# Patient Record
Sex: Female | Born: 1965 | Race: Black or African American | Hispanic: No | Marital: Married | Smoking: Never smoker
Health system: Southern US, Community
[De-identification: ages and names within clinical notes are randomized; demographics above are authoritative.]

## PROBLEM LIST (undated history)

## (undated) DIAGNOSIS — D689 Coagulation defect, unspecified: Secondary | ICD-10-CM

## (undated) DIAGNOSIS — J45909 Unspecified asthma, uncomplicated: Secondary | ICD-10-CM

## (undated) DIAGNOSIS — H269 Unspecified cataract: Secondary | ICD-10-CM

## (undated) DIAGNOSIS — K219 Gastro-esophageal reflux disease without esophagitis: Secondary | ICD-10-CM

## (undated) DIAGNOSIS — I10 Essential (primary) hypertension: Secondary | ICD-10-CM

## (undated) HISTORY — DX: Unspecified asthma, uncomplicated: J45.909

## (undated) HISTORY — PX: NOVASURE ABLATION: SHX5394

## (undated) HISTORY — PX: MYOMECTOMY: SHX85

## (undated) HISTORY — DX: Gastro-esophageal reflux disease without esophagitis: K21.9

## (undated) HISTORY — DX: Essential (primary) hypertension: I10

## (undated) HISTORY — DX: Unspecified cataract: H26.9

## (undated) HISTORY — DX: Coagulation defect, unspecified: D68.9

---

## 2011-07-02 ENCOUNTER — Other Ambulatory Visit: Payer: Self-pay | Admitting: Internal Medicine

## 2011-07-09 ENCOUNTER — Ambulatory Visit (INDEPENDENT_AMBULATORY_CARE_PROVIDER_SITE_OTHER): Payer: BC Managed Care – PPO | Admitting: Internal Medicine

## 2011-07-09 ENCOUNTER — Encounter: Payer: Self-pay | Admitting: Internal Medicine

## 2011-07-09 VITALS — BP 111/72 | HR 72 | Temp 98.6°F | Resp 16 | Ht 59.5 in | Wt 216.8 lb

## 2011-07-09 DIAGNOSIS — Z79899 Other long term (current) drug therapy: Secondary | ICD-10-CM

## 2011-07-09 DIAGNOSIS — I10 Essential (primary) hypertension: Secondary | ICD-10-CM

## 2011-07-09 DIAGNOSIS — E669 Obesity, unspecified: Secondary | ICD-10-CM | POA: Insufficient documentation

## 2011-07-09 MED ORDER — LISINOPRIL-HYDROCHLOROTHIAZIDE 20-25 MG PO TABS: 1.0000 | ORAL_TABLET | Freq: Every day | ORAL | Status: DC

## 2011-07-09 NOTE — Progress Notes (Signed)
Addended by: Mervin Kung on: 07/09/2011 01:21 PM   Modules accepted: Orders

## 2011-07-09 NOTE — Progress Notes (Signed)
  Subjective:    Patient ID: Debra Mendoza, female    DOB: 1966/01/26, 46 y.o.   MRN: [id number]  HPI  HTN controlled Review of Systems    stable Objective:   Physical Exam  Normal      Assessment & Plan:  RF meds

## 2011-12-31 ENCOUNTER — Ambulatory Visit (INDEPENDENT_AMBULATORY_CARE_PROVIDER_SITE_OTHER): Payer: BC Managed Care – PPO | Admitting: Internal Medicine

## 2011-12-31 ENCOUNTER — Encounter: Payer: Self-pay | Admitting: Internal Medicine

## 2011-12-31 VITALS — BP 145/91 | HR 69 | Temp 98.2°F | Resp 18 | Ht 60.0 in | Wt 219.0 lb

## 2011-12-31 DIAGNOSIS — Z79899 Other long term (current) drug therapy: Secondary | ICD-10-CM

## 2011-12-31 DIAGNOSIS — I10 Essential (primary) hypertension: Secondary | ICD-10-CM

## 2011-12-31 DIAGNOSIS — E669 Obesity, unspecified: Secondary | ICD-10-CM

## 2011-12-31 DIAGNOSIS — Z23 Encounter for immunization: Secondary | ICD-10-CM

## 2011-12-31 DIAGNOSIS — Z0000 Encounter for general adult medical examination without abnormal findings: Secondary | ICD-10-CM

## 2011-12-31 LAB — POCT UA - MICROSCOPIC ONLY
Crystals, Ur, HPF, POC: NEGATIVE
Yeast, UA: NEGATIVE

## 2011-12-31 LAB — TSH: TSH: 1.831 u[IU]/mL (ref 0.350–4.500)

## 2011-12-31 LAB — POCT GLYCOSYLATED HEMOGLOBIN (HGB A1C): Hemoglobin A1C: 5.8

## 2011-12-31 LAB — COMPREHENSIVE METABOLIC PANEL
ALT: 27 U/L (ref 0–35)
Albumin: 4.9 g/dL (ref 3.5–5.2)
CO2: 26 mEq/L (ref 19–32)
Calcium: 10.5 mg/dL (ref 8.4–10.5)
Chloride: 99 mEq/L (ref 96–112)
Creat: 1.09 mg/dL (ref 0.50–1.10)
Total Protein: 8.2 g/dL (ref 6.0–8.3)

## 2011-12-31 LAB — POCT URINALYSIS DIPSTICK
Glucose, UA: NEGATIVE
Ketones, UA: NEGATIVE
Leukocytes, UA: NEGATIVE
Spec Grav, UA: <=1.005
Urobilinogen, UA: 0.2

## 2011-12-31 LAB — LIPID PANEL: Cholesterol: 190 mg/dL (ref 0–200)

## 2011-12-31 NOTE — Progress Notes (Signed)
  Subjective:    Patient ID: Debra Mendoza, female    DOB: January 10, 1966, 46 y.o.   MRN: [id number]  HPI Gained weight, is obese Htn controlled at home. See scanned hx   Review of Systems See scanned ros    Objective:   Physical Exam  Constitutional: She is oriented to person, place, and time. Vital signs are normal. She appears well-nourished.       Obese  HENT:  Right Ear: External ear normal.  Left Ear: External ear normal.  Nose: Nose normal.  Mouth/Throat: Oropharynx is clear and moist.  Eyes: EOM are normal. Pupils are equal, round, and reactive to light.  Neck: Neck supple. No thyromegaly present.  Cardiovascular: Normal rate, regular rhythm and normal heart sounds.   Pulmonary/Chest: Effort normal and breath sounds normal.  Abdominal: Soft.  Genitourinary: No breast swelling, tenderness, discharge or bleeding. Pelvic exam was performed with patient supine.       Gyn does exam  Musculoskeletal: Normal range of motion.  Lymphadenopathy:    She has no cervical adenopathy.  Neurological: She is alert and oriented to person, place, and time. No cranial nerve deficit. Coordination normal.  Skin: Skin is warm and dry.  Psychiatric: She has a normal mood and affect.   ekg nl       Assessment & Plan:  HTN controlled Obesity needs attn

## 2011-12-31 NOTE — Patient Instructions (Signed)
Obesity Obesity is defined as having a body mass index (BMI) of 30 or more. To calculate your BMI divide your weight in pounds by your height in inches squared and multiply that product by 703. Major illnesses resulting from long-term obesity include:  Stroke.   Heart disease.   Diabetes.   Many cancers.   Arthritis.  Obesity also complicates recovery from many other medical problems.  CAUSES   A history of obesity in your parents.   Thyroid hormone imbalance.   Environmental factors such as excess calorie intake and physical inactivity.  TREATMENT  A healthy weight loss program includes:  A calorie restricted diet based on individual calorie needs.   Increased physical activity (exercise).  An exercise program is just as important as the right low-calorie diet.  Weight-loss medicines should be used only under the supervision of your physician. These medicines help, but only if they are used with diet and exercise programs. Medicines can have side effects including nervousness, nausea, abdominal pain, diarrhea, headache, drowsiness, and depression.  An unhealthy weight loss program includes:  Fasting.   Fad diets.   Supplements and drugs.  These choices do not succeed in long-term weight control.  HOME CARE INSTRUCTIONS  To help you make the needed dietary changes:   Exercise and perform physical activity as directed by your caregiver.   Keep a daily record of everything you eat. There are many free websites to help you with this. It may be helpful to measure your foods so you can determine if you are eating the correct portion sizes.   Use low-calorie cookbooks or take special cooking classes.   Avoid alcohol. Drink more water and drinks with no calories.   Take vitamins and supplements only as recommended by your caregiver.   Weight loss support groups, Registered Dieticians, counselors, and stress reduction education can also be very helpful.  Document Released:  04/26/2004 Document Revised: 03/08/2011 Document Reviewed: 02/23/2007 [facility] Patient Information 2012 [place], [place].Calorie Counting Diet A calorie counting diet requires you to eat the number of calories that are right for you in a day. Calories are the measurement of how much energy you get from the food you eat. Eating the right amount of calories is important for staying at a healthy weight. If you eat too many calories, your body will store them as fat and you may gain weight. If you eat too few calories, you may lose weight. Counting the number of calories you eat during a day will help you know if you are eating the right amount. A Registered Dietitian can determine how many calories you need in a day. The amount of calories needed varies from person to person. If your goal is to lose weight, you will need to eat fewer calories. Losing weight can benefit you if you are overweight or have health problems such as heart disease, high blood pressure, or diabetes. If your goal is to gain weight, you will need to eat more calories. Gaining weight may be necessary if you have a certain health problem that causes your body to need more energy. TIPS Whether you are increasing or decreasing the number of calories you eat during a day, it may be hard to get used to changes in what you eat and drink. The following are tips to help you keep track of the number of calories you eat.  Measure foods at home with measuring cups. This helps you know the amount of food and number of calories you  are eating.   Restaurants often serve food in amounts that are larger than 1 serving. While eating out, estimate how many servings of a food you are given. For example, a serving of cooked rice is  cup or about the size of half of a fist. Knowing serving sizes will help you be aware of how much food you are eating at restaurants.   Ask for smaller portion sizes or child-size portions at restaurants.   Plan to eat half  of a meal at a restaurant. Take the rest home or share the other half with a friend.   Read the Nutrition Facts panel on food labels for calorie content and serving size. You can find out how many servings are in a package, the size of a serving, and the number of calories each serving has.   For example, a package might contain 3 cookies. The Nutrition Facts panel on that package says that 1 serving is 1 cookie. Below that, it will say there are 3 servings in the container. The calories section of the Nutrition Facts label says there are 90 calories. This means there are 90 calories in 1 cookie (1 serving). If you eat 1 cookie you have eaten 90 calories. If you eat all 3 cookies, you have eaten 270 calories (3 servings x 90 calories = 270 calories).  The list below tells you how big or small some common portion sizes are.  1 oz.........4 stacked dice.   3 oz........Marland KitchenDeck of cards.   1 tsp.......Marland KitchenTip of little finger.   1 tbs......Marland KitchenMarland KitchenThumb.   2 tbs.......Marland KitchenGolf ball.    cup......Marland KitchenHalf of a fist.   1 cup.......Marland KitchenA fist.  KEEP A FOOD LOG Write down every food item you eat, the amount you eat, and the number of calories in each food you eat during the day. At the end of the day, you can add up the total number of calories you have eaten. It may help to keep a list like the one below. Find out the calorie information by reading the Nutrition Facts panel on food labels. Breakfast  Bran cereal (1 cup, 110 calories).   Fat-free milk ( cup, 45 calories).  Snack  Apple (1 medium, 80 calories).  Lunch  Spinach (1 cup, 20 calories).   Tomato ( medium, 20 calories).   Chicken breast strips (3 oz, 165 calories).   Shredded cheddar cheese ( cup, 110 calories).   Light [place] dressing (2 tbs, 60 calories).   Whole-wheat bread (1 slice, 80 calories).   Tub margarine (1 tsp, 35 calories).   Vegetable soup (1 cup, 160 calories).  Dinner  Pork chop (3 oz, 190 calories).   Brown rice  (1 cup, 215 calories).   Steamed broccoli ( cup, 20 calories).   Strawberries (1  cup, 65 calories).   Whipped cream (1 tbs, 50 calories).  Daily Calorie Total: 1425 Document Released: 03/19/2005 Document Revised: 03/08/2011 Document Reviewed: 09/13/2006 [facility] Patient Information 2012 [place], [place].

## 2012-01-01 ENCOUNTER — Encounter: Payer: Self-pay | Admitting: Radiology

## 2012-01-01 LAB — MICROALBUMIN, URINE: Microalb, Ur: 0.5 mg/dL (ref 0.00–1.89)

## 2012-04-07 ENCOUNTER — Ambulatory Visit (INDEPENDENT_AMBULATORY_CARE_PROVIDER_SITE_OTHER): Payer: BC Managed Care – PPO | Admitting: Family Medicine

## 2012-04-07 VITALS — BP 151/84 | HR 108 | Temp 102.8°F | Resp 20 | Ht 61.0 in | Wt 216.8 lb

## 2012-04-07 DIAGNOSIS — R05 Cough: Secondary | ICD-10-CM

## 2012-04-07 DIAGNOSIS — R509 Fever, unspecified: Secondary | ICD-10-CM

## 2012-04-07 DIAGNOSIS — J111 Influenza due to unidentified influenza virus with other respiratory manifestations: Secondary | ICD-10-CM

## 2012-04-07 LAB — POCT INFLUENZA A/B
Influenza A, POC: POSITIVE
Influenza B, POC: NEGATIVE

## 2012-04-07 MED ORDER — OSELTAMIVIR PHOSPHATE 75 MG PO CAPS: 75.0000 mg | ORAL_CAPSULE | Freq: Two times a day (BID) | ORAL | Status: DC

## 2012-04-07 MED ORDER — HYDROCODONE-HOMATROPINE 5-1.5 MG/5ML PO SYRP: 5.0000 mL | ORAL_SOLUTION | Freq: Three times a day (TID) | ORAL | Status: DC | PRN

## 2012-04-07 NOTE — Patient Instructions (Addendum)
You have the flu.  Rest and drink plenty of fluids, use the cough syrup as needed.  If you are not better in the next few days please let us know- Sooner if worse.

## 2012-04-07 NOTE — Progress Notes (Signed)
[facility] [address] [place] [address] [phone]  Date:  04/07/2012   Name:  Debra Mendoza   DOB:  Apr 12, 1965   MRN:  [id number]  PCP:  No primary provider on file.    Chief Complaint: Cough and Fever   History of Present Illness:  Debra Mendoza is a 47 y.o. very pleasant female patient who presents with the following:  She is here today with illness for the last 2 days.  She noted a cough, fever, body aches, chills.  No ST.  Nose is running, HA.  She has not taken any medication for fever today- yesterday she used ibuprofen and mucinex.    She is generally healthy except for HTN.  She will need a note for her job.    She does not get menses- she had an ablation performed in 2004.   Patient Active Problem List  Diagnosis  . HTN (hypertension)  . Obesity    Past Medical History  Diagnosis Date  . Hypertension     Past Surgical History  Procedure Date  . Myomectomy     History  Substance Use Topics  . Smoking status: Never Smoker   . Smokeless tobacco: Not on file  . Alcohol Use: No    Family History  Problem Relation Age of Onset  . Hypertension Mother     No Known Allergies  Medication list has been reviewed and updated.  Current Outpatient Prescriptions on File Prior to Visit  Medication Sig Dispense Refill  . amLODipine (NORVASC) 10 MG tablet Take 10 mg by mouth daily.      . Ascorbic Acid (VITAMIN C) 100 MG tablet Take 100 mg by mouth daily.      Marland Kitchen lisinopril-hydrochlorothiazide (PRINZIDE,ZESTORETIC) 20-25 MG per tablet Take 1 tablet by mouth daily. Patient needs office visit for more  90 tablet  3  . norethindrone (AYGESTIN) 5 MG tablet Take 5 mg by mouth daily.      . potassium chloride (K-DUR,KLOR-CON) 10 MEQ tablet Take 10 mEq by mouth 2 (two) times daily.        Review of Systems:  As per HPI- otherwise negative.   Physical Examination: Filed Vitals:   04/07/12 1307  BP: 151/84  Pulse: 108    Temp: 102.8 F (39.3 C)  Resp: 20   Filed Vitals:   04/07/12 1307  Height: 5\' 1"  (1.549 m)  Weight: 216 lb 12.8 oz (98.34 kg)   Body mass index is 40.96 kg/(m^2). Ideal Body Weight: Weight in (lb) to have BMI = 25: 132   GEN: WDWN, NAD, Non-toxic, A & O x 3 HEENT: Atraumatic, Normocephalic. Neck supple. No masses, No LAD. Ears and Nose: No external deformity. CV: RRR, No M/G/R. No JVD. No thrill. No extra heart sounds. PULM: CTA B, no wheezes, crackles, rhonchi. No retractions. No resp. distress. No accessory muscle use. ABD: S, NT, ND, +BS. No rebound. No HSM. EXTR: No c/c/e NEURO Normal gait.  PSYCH: Normally interactive. Conversant. Not depressed or anxious appearing.  Calm demeanor.   Results for orders placed in visit on 04/07/12  POCT INFLUENZA A/B      Component Value Range   Influenza A, POC Positive     Influenza B, POC Negative     Ibuprofen 600 mg by mouth- temp to 100.8  Assessment and Plan: 1. Fever  POCT Influenza A/B  2. Cough    3. Influenza  oseltamivir (TAMIFLU) 75 MG capsule, HYDROcodone-homatropine (  HYCODAN) 5-1.5 MG/5ML syrup  flu- treat as above.  See patient instructions for more details.   Given a note for work this week.    Abbe Amsterdam, MD

## 2012-04-19 ENCOUNTER — Ambulatory Visit (INDEPENDENT_AMBULATORY_CARE_PROVIDER_SITE_OTHER): Payer: BC Managed Care – PPO | Admitting: Family Medicine

## 2012-04-19 ENCOUNTER — Ambulatory Visit: Payer: BC Managed Care – PPO

## 2012-04-19 VITALS — BP 141/93 | HR 89 | Temp 98.7°F | Resp 20 | Ht 60.5 in | Wt 211.6 lb

## 2012-04-19 DIAGNOSIS — J189 Pneumonia, unspecified organism: Secondary | ICD-10-CM

## 2012-04-19 LAB — POCT CBC
Granulocyte percent: 71.6 %G (ref 37–80)
HCT, POC: 50.2 % — AB (ref 37.7–47.9)
Hemoglobin: 16 g/dL (ref 12.2–16.2)
Lymph, poc: 3.9 — AB (ref 0.6–3.4)
MCH, POC: 28.3 pg (ref 27–31.2)
MCHC: 31.9 g/dL (ref 31.8–35.4)
MCV: 88.7 fL (ref 80–97)
MID (cbc): 1.1 — AB (ref 0–0.9)
MPV: 9 fL (ref 0–99.8)
POC Granulocyte: 12.7 — AB (ref 2–6.9)
POC LYMPH PERCENT: 22 %L (ref 10–50)
POC MID %: 6.4 %M (ref 0–12)
Platelet Count, POC: 520 10*3/uL — AB (ref 142–424)
RBC: 5.66 M/uL — AB (ref 4.04–5.48)
RDW, POC: 15 %
WBC: 17.7 10*3/uL — AB (ref 4.6–10.2)

## 2012-04-19 MED ORDER — PREDNISONE 20 MG PO TABS: ORAL_TABLET | ORAL | Status: DC

## 2012-04-19 MED ORDER — LEVOFLOXACIN 500 MG PO TABS: 500.0000 mg | ORAL_TABLET | Freq: Every day | ORAL | Status: DC

## 2012-04-19 NOTE — Patient Instructions (Addendum)
Return on Wednesday between 8 and 5 for recheck before returning to work   Pneumonia, Adult Pneumonia is an infection of the lungs.  CAUSES Pneumonia may be caused by bacteria or a virus. Usually, these infections are caused by breathing infectious particles into the lungs (respiratory tract). SYMPTOMS   Cough.  Fever.  Chest pain.  Increased rate of breathing.  Wheezing.  Mucus production. DIAGNOSIS  If you have the common symptoms of pneumonia, your caregiver will typically confirm the diagnosis with a chest X-ray. The X-ray will show an abnormality in the lung (pulmonary infiltrate) if you have pneumonia. Other tests of your blood, urine, or sputum may be done to find the specific cause of your pneumonia. Your caregiver may also do tests (blood gases or pulse oximetry) to see how well your lungs are working. TREATMENT  Some forms of pneumonia may be spread to other people when you cough or sneeze. You may be asked to wear a mask before and during your exam. Pneumonia that is caused by bacteria is treated with antibiotic medicine. Pneumonia that is caused by the influenza virus may be treated with an antiviral medicine. Most other viral infections must run their course. These infections will not respond to antibiotics.  PREVENTION A pneumococcal shot (vaccine) is available to prevent a common bacterial cause of pneumonia. This is usually suggested for:  People over 32 years old.  Patients on chemotherapy.  People with chronic lung problems, such as bronchitis or emphysema.  People with immune system problems. If you are over 65 or have a high risk condition, you may receive the pneumococcal vaccine if you have not received it before. In some countries, a routine influenza vaccine is also recommended. This vaccine can help prevent some cases of pneumonia.You may be offered the influenza vaccine as part of your care. If you smoke, it is time to quit. You may receive instructions  on how to stop smoking. Your caregiver can provide medicines and counseling to help you quit. HOME CARE INSTRUCTIONS   Cough suppressants may be used if you are losing too much rest. However, coughing protects you by clearing your lungs. You should avoid using cough suppressants if you can.  Your caregiver may have prescribed medicine if he or she thinks your pneumonia is caused by a bacteria or influenza. Finish your medicine even if you start to feel better.  Your caregiver may also prescribe an expectorant. This loosens the mucus to be coughed up.  Only take over-the-counter or prescription medicines for pain, discomfort, or fever as directed by your caregiver.  Do not smoke. Smoking is a common cause of bronchitis and can contribute to pneumonia. If you are a smoker and continue to smoke, your cough may last several weeks after your pneumonia has cleared.  A cold steam vaporizer or humidifier in your room or home may help loosen mucus.  Coughing is often worse at night. Sleeping in a semi-upright position in a recliner or using a couple pillows under your head will help with this.  Get rest as you feel it is needed. Your body will usually let you know when you need to rest. SEEK IMMEDIATE MEDICAL CARE IF:   Your illness becomes worse. This is especially true if you are elderly or weakened from any other disease.  You cannot control your cough with suppressants and are losing sleep.  You begin coughing up blood.  You develop pain which is getting worse or is uncontrolled with medicines.  You  have a fever.  Any of the symptoms which initially brought you in for treatment are getting worse rather than better.  You develop shortness of breath or chest pain. MAKE SURE YOU:   Understand these instructions.  Will watch your condition.  Will get help right away if you are not doing well or get worse. Document Released: 03/19/2005 Document Revised: 06/11/2011 Document Reviewed:  06/08/2010 [facility] Patient Information 2013 [place], [place].

## 2012-04-19 NOTE — Progress Notes (Signed)
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  Felt better, but then became ill January 4th or so and came here, dx'd with flu.  She then worsened and was admitted to ICU in [facility] with pneumonia.  She was discharged last Monday the thirteenth.  She is still short of breath.  Cough has subsided.  Using the Advair bid (no h/o asthma) Last day of Levaquin, Medrol  Objective:  NAD, obese AA woman Chest:  Bilateral rales and expiratory wheezes Neck: supple Heart:  Reg, no murmur  [facility] reading (PRIMARY) by  Dr. Milus Glazier:  Poor inspiration, no definite infiltrate Results for orders placed in visit on 04/19/12  POCT CBC      Component Value Range   WBC 17.7 (*) 4.6 - 10.2 K/uL   Lymph, poc 3.9 (*) 0.6 - 3.4   POC LYMPH PERCENT 22.0  10 - 50 %L   MID (cbc) 1.1 (*) 0 - 0.9   POC MID % 6.4  0 - 12 %M   POC Granulocyte 12.7 (*) 2 - 6.9   Granulocyte percent 71.6  37 - 80 %G   RBC 5.66 (*) 4.04 - 5.48 M/uL   Hemoglobin 16.0  12.2 - 16.2 g/dL   HCT, POC 16.1 (*) 09.6 - 47.9 %   MCV 88.7  80 - 97 fL   MCH, POC 28.3  27 - 31.2 pg   MCHC 31.9  31.8 - 35.4 g/dL   RDW, POC 04.5     Platelet Count, POC 520 (*) 142 - 424 K/uL   MPV 9.0  0 - 99.8 fL   .  Assessment:  Pneumonia, improving.  Suspect the increased WBC is the prednisone.    Plan:  Continue 5 more days of prednisone and levaquin Recheck before going back to work

## 2012-04-23 ENCOUNTER — Ambulatory Visit (INDEPENDENT_AMBULATORY_CARE_PROVIDER_SITE_OTHER): Payer: BC Managed Care – PPO | Admitting: Family Medicine

## 2012-04-23 ENCOUNTER — Ambulatory Visit: Payer: BC Managed Care – PPO

## 2012-04-23 VITALS — BP 120/77 | HR 80 | Temp 98.5°F | Resp 18 | Wt 211.0 lb

## 2012-04-23 DIAGNOSIS — J189 Pneumonia, unspecified organism: Secondary | ICD-10-CM

## 2012-04-23 NOTE — Patient Instructions (Addendum)
We will try to have you return to work on Monday.  However, if you are getting worse in the meantime call or come back in.   In 2 or 3 weeks please come back in to have a follow- up chest x-ray and CBC.  You do not have to see a doctor to have these tests done- there is an order on your chart.  You may also show this paperwork.

## 2012-04-23 NOTE — Progress Notes (Signed)
[facility] [address], [place] [address] [phone]  Date:  04/23/2012   Name:  Debra Mendoza   DOB:  1965/10/03   MRN:  [id number]  PCP:  No primary provider on file.    Chief Complaint: illness recheck   History of Present Illness:  Debra Mendoza is a 47 y.o. very pleasant female patient who presents with the following:  She was here on 04/07/12- diagnosed with flu and treated with tamiflu.  However, she got worse and had to be admitted to the ICU at [facility] with pneumonia.  She was discharged on 04/14/12, followed up here on 04/19/12 with Dr. Milus Glazier.  She was noted to have persistent leukocytosis.  She was given 5 days more of levaquin and prednisone. She will take her last dose of levaquin today.    She feels that she is doing much better.  Her SOB is improved.  She is not having fevers any more.  Her breathing is much better- her exercise tolerance is not yet normal.  She was supposed to RTW tomorrow.  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  She will be at work all day and is not sure if she is up to this as of yet.   Patient Active Problem List  Diagnosis  . HTN (hypertension)  . Obesity    Past Medical History  Diagnosis Date  . Hypertension     Past Surgical History  Procedure Date  . Myomectomy   . Novasure ablation     History  Substance Use Topics  . Smoking status: Never Smoker   . Smokeless tobacco: Not on file  . Alcohol Use: No    Family History  Problem Relation Age of Onset  . Hypertension Mother   . Hypertension Father   . Heart disease Father   . Hypertension Maternal Grandmother     No Known Allergies  Medication list has been reviewed and updated.  Current Outpatient Prescriptions on File Prior to Visit  Medication Sig Dispense Refill  . amLODipine (NORVASC) 10 MG tablet Take 10 mg by mouth daily.      . Ascorbic Acid (VITAMIN C) 100 MG tablet Take 100 mg  by mouth daily.      Marland Kitchen levofloxacin (LEVAQUIN) 500 MG tablet Take 1 tablet (500 mg total) by mouth daily.  5 tablet  0  . lisinopril-hydrochlorothiazide (PRINZIDE,ZESTORETIC) 20-25 MG per tablet Take 1 tablet by mouth daily. Patient needs office visit for more  90 tablet  3  . norethindrone (AYGESTIN) 5 MG tablet Take 5 mg by mouth daily.      . potassium chloride (K-DUR,KLOR-CON) 10 MEQ tablet Take 10 mEq by mouth 2 (two) times daily.      . predniSONE (DELTASONE) 20 MG tablet Two daily with food  10 tablet  0    Review of Systems:  As per HPI- otherwise negative.   Physical Examination: Filed Vitals:   04/23/12 0934  BP: 120/77  Pulse: 80  Temp: 98.5 F (36.9 C)  Resp: 18   Filed Vitals:   04/23/12 0934  Weight: 211 lb (95.709 kg)   There is no height on file to calculate BMI. Ideal Body Weight:    GEN: WDWN, NAD, Non-toxic, A & O x 3, obese HEENT: Atraumatic, Normocephalic. Neck supple. No masses, No LAD. Bilateral TM wnl, oropharynx normal.  PEERL,EOMI.   Ears and Nose: No external deformity. CV: RRR,  No M/G/R. No JVD. No thrill. No extra heart sounds. PULM: CTA B, no wheezes, crackles, rhonchi. No retractions. No resp. distress. No accessory muscle use. ABD: S, NT, ND EXTR: No c/c/e NEURO Normal gait.  PSYCH: Normally interactive. Conversant. Not depressed or anxious appearing.  Calm demeanor.    Assessment and Plan: 1. Pneumonia  POCT CBC, DG Chest 2 View   Recent severe flu- associated pneumonia. She is better, but not yet strong enough to RTW.  Plan to have her return on Monday.  She will let us know if not continuing to improve.  Plan to recheck for just a CBC and CXR in 2 or 3 weeks to be sure both have normalized. As she was just on prednisone will not recheck CBC today   Joscelin Fray, MD

## 2012-06-16 ENCOUNTER — Encounter: Payer: Self-pay | Admitting: Internal Medicine

## 2012-06-30 ENCOUNTER — Ambulatory Visit: Payer: BC Managed Care – PPO | Admitting: Internal Medicine

## 2012-10-06 ENCOUNTER — Ambulatory Visit (INDEPENDENT_AMBULATORY_CARE_PROVIDER_SITE_OTHER): Payer: BC Managed Care – PPO | Admitting: Internal Medicine

## 2012-10-06 ENCOUNTER — Encounter: Payer: Self-pay | Admitting: Internal Medicine

## 2012-10-06 VITALS — BP 120/86 | HR 75 | Temp 98.5°F | Resp 16 | Ht 59.5 in | Wt 217.4 lb

## 2012-10-06 DIAGNOSIS — I10 Essential (primary) hypertension: Secondary | ICD-10-CM

## 2012-10-06 DIAGNOSIS — Z79899 Other long term (current) drug therapy: Secondary | ICD-10-CM

## 2012-10-06 LAB — COMPREHENSIVE METABOLIC PANEL
Albumin: 4.4 g/dL (ref 3.5–5.2)
Alkaline Phosphatase: 17 U/L — ABNORMAL LOW (ref 39–117)
BUN: 14 mg/dL (ref 6–23)
Calcium: 10.2 mg/dL (ref 8.4–10.5)
Chloride: 100 mEq/L (ref 96–112)
Creat: 0.94 mg/dL (ref 0.50–1.10)
Glucose, Bld: 81 mg/dL (ref 70–99)
Potassium: 4.1 mEq/L (ref 3.5–5.3)

## 2012-10-06 LAB — CBC
Hemoglobin: 15.1 g/dL — ABNORMAL HIGH (ref 12.0–15.0)
RBC: 5.35 MIL/uL — ABNORMAL HIGH (ref 3.87–5.11)

## 2012-10-06 MED ORDER — LISINOPRIL-HYDROCHLOROTHIAZIDE 20-25 MG PO TABS: 1.0000 | ORAL_TABLET | Freq: Every day | ORAL | Status: DC

## 2012-10-06 NOTE — Patient Instructions (Signed)
DASH Diet  The DASH diet stands for "Dietary Approaches to Stop Hypertension." It is a healthy eating plan that has been shown to reduce high blood pressure (hypertension) in as little as 14 days, while also possibly providing other significant health benefits. These other health benefits include reducing the risk of breast cancer after menopause and reducing the risk of type 2 diabetes, heart disease, colon cancer, and stroke. Health benefits also include weight loss and slowing kidney failure in patients with chronic kidney disease.   DIET GUIDELINES  · Limit salt (sodium). Your diet should contain less than 1500 mg of sodium daily.  · Limit refined or processed carbohydrates. Your diet should include mostly whole grains. Desserts and added sugars should be used sparingly.  · Include small amounts of heart-healthy fats. These types of fats include nuts, oils, and tub margarine. Limit saturated and trans fats. These fats have been shown to be harmful in the body.  CHOOSING FOODS   The following food groups are based on a 2000 calorie diet. See your Registered Dietitian for individual calorie needs.  Grains and Grain Products (6 to 8 servings daily)  · Eat More Often: Whole-wheat bread, brown rice, whole-grain or wheat pasta, quinoa, popcorn without added fat or salt (air popped).  · Eat Less Often: White bread, white pasta, white rice, cornbread.  Vegetables (4 to 5 servings daily)  · Eat More Often: Fresh, frozen, and canned vegetables. Vegetables may be raw, steamed, roasted, or grilled with a minimal amount of fat.  · Eat Less Often/Avoid: Creamed or fried vegetables. Vegetables in a cheese sauce.  Fruit (4 to 5 servings daily)  · Eat More Often: All fresh, canned (in natural juice), or frozen fruits. Dried fruits without added sugar. One hundred percent fruit juice (½ cup [237 mL] daily).  · Eat Less Often: Dried fruits with added sugar. Canned fruit in light or heavy syrup.  Lean Meats, Fish, and Poultry (2  servings or less daily. One serving is 3 to 4 oz [85-114 g]).  · Eat More Often: Ninety percent or leaner ground beef, tenderloin, sirloin. Round cuts of beef, chicken breast, turkey breast. All fish. Grill, bake, or broil your meat. Nothing should be fried.  · Eat Less Often/Avoid: Fatty cuts of meat, turkey, or chicken leg, thigh, or wing. Fried cuts of meat or fish.  Dairy (2 to 3 servings)  · Eat More Often: Low-fat or fat-free milk, low-fat plain or light yogurt, reduced-fat or part-skim cheese.  · Eat Less Often/Avoid: Milk (whole, 2%). Whole milk yogurt. Full-fat cheeses.  Nuts, Seeds, and Legumes (4 to 5 servings per week)  · Eat More Often: All without added salt.  · Eat Less Often/Avoid: Salted nuts and seeds, canned beans with added salt.  Fats and Sweets (limited)  · Eat More Often: Vegetable oils, tub margarines without trans fats, sugar-free gelatin. Mayonnaise and salad dressings.  · Eat Less Often/Avoid: Coconut oils, palm oils, butter, stick margarine, cream, half and half, cookies, candy, pie.  FOR MORE INFORMATION  The Dash Diet Eating Plan: www.dashdiet.org  Document Released: 03/08/2011 Document Revised: 06/11/2011 Document Reviewed: 03/08/2011  [employer]® Patient Information ©2014 [employer].

## 2012-10-06 NOTE — Progress Notes (Signed)
  Subjective:    Patient ID: Debra Mendoza, female    DOB: May 02, 1965, 47 y.o.   MRN: [id number]  HPI Doing great, htn controlled. Work and home are good. No fhx of colon cancer.   Review of Systems unchanged    Objective:   Physical Exam  Vitals reviewed. Constitutional: She is oriented to person, place, and time. She appears well-nourished.  Eyes: EOM are normal.  Cardiovascular: Normal rate, regular rhythm and normal heart sounds.   Pulmonary/Chest: Effort normal and breath sounds normal.  Neurological: She is alert and oriented to person, place, and time. She exhibits normal muscle tone. Coordination normal.  Psychiatric: She has a normal mood and affect.          Assessment & Plan:  HTN controlled/RF meds 47yr

## 2012-10-08 ENCOUNTER — Encounter: Payer: Self-pay | Admitting: Family Medicine

## 2013-01-03 ENCOUNTER — Ambulatory Visit (INDEPENDENT_AMBULATORY_CARE_PROVIDER_SITE_OTHER): Payer: BC Managed Care – PPO | Admitting: Physician Assistant

## 2013-01-03 VITALS — HR 50 | Temp 99.0°F | Resp 18 | Ht 60.0 in | Wt 220.0 lb

## 2013-01-03 DIAGNOSIS — Z23 Encounter for immunization: Secondary | ICD-10-CM

## 2013-01-03 NOTE — Progress Notes (Signed)
Patient here for flu shot only. Not seen by a provider.

## 2013-01-04 ENCOUNTER — Encounter: Payer: Self-pay | Admitting: Radiology

## 2013-04-06 ENCOUNTER — Encounter: Payer: BC Managed Care – PPO | Admitting: Internal Medicine

## 2013-04-06 ENCOUNTER — Ambulatory Visit (INDEPENDENT_AMBULATORY_CARE_PROVIDER_SITE_OTHER): Payer: BC Managed Care – PPO | Admitting: Internal Medicine

## 2013-04-06 VITALS — BP 132/76 | HR 50 | Temp 98.8°F | Resp 12 | Ht 61.0 in | Wt 220.0 lb

## 2013-04-06 DIAGNOSIS — Z0000 Encounter for general adult medical examination without abnormal findings: Secondary | ICD-10-CM

## 2013-04-06 DIAGNOSIS — I10 Essential (primary) hypertension: Secondary | ICD-10-CM

## 2013-04-06 DIAGNOSIS — Z79899 Other long term (current) drug therapy: Secondary | ICD-10-CM

## 2013-04-06 DIAGNOSIS — E663 Overweight: Secondary | ICD-10-CM

## 2013-04-06 LAB — POCT URINALYSIS DIPSTICK
BILIRUBIN UA: NEGATIVE
GLUCOSE UA: NEGATIVE
Ketones, UA: NEGATIVE
NITRITE UA: NEGATIVE
RBC UA: NEGATIVE
Spec Grav, UA: 1.015
UROBILINOGEN UA: 0.2
pH, UA: 5.5

## 2013-04-06 LAB — POCT CBC
GRANULOCYTE PERCENT: 42.9 % (ref 37–80)
HEMATOCRIT: 51.5 % — AB (ref 37.7–47.9)
HEMOGLOBIN: 16.5 g/dL — AB (ref 12.2–16.2)
Lymph, poc: 3.5 — AB (ref 0.6–3.4)
MCH, POC: 29 pg (ref 27–31.2)
MCHC: 32 g/dL (ref 31.8–35.4)
MCV: 90.7 fL (ref 80–97)
MID (cbc): 0.5 (ref 0–0.9)
MPV: 11.8 fL (ref 0–99.8)
POC GRANULOCYTE: 3 (ref 2–6.9)
POC LYMPH %: 49.8 % (ref 10–50)
POC MID %: 7.3 %M (ref 0–12)
Platelet Count, POC: 158 10*3/uL (ref 142–424)
RBC: 5.68 M/uL — AB (ref 4.04–5.48)
RDW, POC: 14.6 %
WBC: 7 10*3/uL (ref 4.6–10.2)

## 2013-04-06 LAB — LIPID PANEL
CHOL/HDL RATIO: 7.2 ratio
Cholesterol: 210 mg/dL — ABNORMAL HIGH (ref 0–200)
HDL: 29 mg/dL — ABNORMAL LOW (ref >39)
LDL Cholesterol: 160 mg/dL — ABNORMAL HIGH (ref 0–99)
Triglycerides: 106 mg/dL (ref <150)
VLDL: 21 mg/dL (ref 0–40)

## 2013-04-06 LAB — COMPREHENSIVE METABOLIC PANEL
ALBUMIN: 4.5 g/dL (ref 3.5–5.2)
ALK PHOS: 17 U/L — AB (ref 39–117)
ALT: 19 U/L (ref 0–35)
AST: 20 U/L (ref 0–37)
BILIRUBIN TOTAL: 1.2 mg/dL (ref 0.3–1.2)
BUN: 13 mg/dL (ref 6–23)
CO2: 23 meq/L (ref 19–32)
Calcium: 10.2 mg/dL (ref 8.4–10.5)
Chloride: 100 mEq/L (ref 96–112)
Creat: 1.05 mg/dL (ref 0.50–1.10)
GLUCOSE: 101 mg/dL — AB (ref 70–99)
POTASSIUM: 3.8 meq/L (ref 3.5–5.3)
SODIUM: 135 meq/L (ref 135–145)
TOTAL PROTEIN: 7.9 g/dL (ref 6.0–8.3)

## 2013-04-06 LAB — GLUCOSE, POCT (MANUAL RESULT ENTRY): POC GLUCOSE: 5.6 mg/dL — AB (ref 70–99)

## 2013-04-06 LAB — POCT UA - MICROSCOPIC ONLY
AMORPHOUS: POSITIVE
CRYSTALS, UR, HPF, POC: NEGATIVE
Casts, Ur, LPF, POC: NEGATIVE
Mucus, UA: POSITIVE
RBC, URINE, MICROSCOPIC: NEGATIVE
Yeast, UA: NEGATIVE

## 2013-04-06 LAB — POCT GLYCOSYLATED HEMOGLOBIN (HGB A1C): HEMOGLOBIN A1C: 5.6

## 2013-04-06 LAB — TSH: TSH: 4.311 u[IU]/mL (ref 0.350–4.500)

## 2013-04-06 MED ORDER — LISINOPRIL-HYDROCHLOROTHIAZIDE 20-25 MG PO TABS: 1.0000 | ORAL_TABLET | Freq: Every day | ORAL | Status: DC

## 2013-04-06 NOTE — Progress Notes (Signed)
Subjective:    Patient ID: Debra Mendoza, female    DOB: 1965-06-05, 48 y.o.   MRN: [id number]  HPI Pt here for a complete physical, last physical was last year. Pt does not have complaints today. Up to date in all immunizations. Pt goes to her gynecologist  Dr.Deon Adah Perl for her pap smear that have been normal. Last mammogram June 2014 Family hx beast and thyroid cancers.  Review of Systems  Constitutional: Negative.   HENT: Negative.   Eyes: Negative.   Respiratory: Negative.   Cardiovascular: Negative.   Gastrointestinal: Negative.   Endocrine: Negative.   Genitourinary: Negative.   Musculoskeletal: Negative.   Skin: Negative.   Allergic/Immunologic: Negative.   Neurological: Negative.   Hematological: Negative.   Psychiatric/Behavioral: Negative.        Objective:   Physical Exam  Vitals reviewed. Constitutional: She is oriented to person, place, and time. She appears well-developed and well-nourished. No distress.  HENT:  Head: Normocephalic.  Right Ear: External ear normal.  Left Ear: External ear normal.  Nose: Nose normal.  Mouth/Throat: Oropharynx is clear and moist.  Eyes: Conjunctivae and EOM are normal. Pupils are equal, round, and reactive to light.  Neck: Normal range of motion. Neck supple. No tracheal deviation present. No thyromegaly present.  Cardiovascular: Normal rate, regular rhythm, normal heart sounds and intact distal pulses.   Pulmonary/Chest: Effort normal and breath sounds normal.  Abdominal: Soft.  Musculoskeletal: Normal range of motion.  Lymphadenopathy:    She has no cervical adenopathy.  Neurological: She is alert and oriented to person, place, and time. No cranial nerve deficit. She exhibits normal muscle tone. Coordination normal.  Skin: No rash noted.  Psychiatric: She has a normal mood and affect. Her behavior is normal. Judgment and thought content normal.     Results for orders placed in visit on 04/06/13  POCT CBC      Result Value Range   WBC 7.0  4.6 - 10.2 K/uL   Lymph, poc 3.5 (*) 0.6 - 3.4   POC LYMPH PERCENT 49.8  10 - 50 %L   MID (cbc) 0.5  0 - 0.9   POC MID % 7.3  0 - 12 %M   POC Granulocyte 3.0  2 - 6.9   Granulocyte percent 42.9  37 - 80 %G   RBC 5.68 (*) 4.04 - 5.48 M/uL   Hemoglobin 16.5 (*) 12.2 - 16.2 g/dL   HCT, POC 51.5 (*) 37.7 - 47.9 %   MCV 90.7  80 - 97 fL   MCH, POC 29.0  27 - 31.2 pg   MCHC 32.0  31.8 - 35.4 g/dL   RDW, POC 14.6     Platelet Count, POC 158  142 - 424 K/uL   MPV 11.8  0 - 99.8 fL  GLUCOSE, POCT (MANUAL RESULT ENTRY)      Result Value Range   POC Glucose 5.6 (*) 70 - 99 mg/dl  POCT GLYCOSYLATED HEMOGLOBIN (HGB A1C)      Result Value Range   Hemoglobin A1C 5.6    POCT URINALYSIS DIPSTICK      Result Value Range   Color, UA yellow     Clarity, UA cloudy     Glucose, UA neg     Bilirubin, UA neg     Ketones, UA neg     Spec Grav, UA 1.015     Blood, UA neg     pH, UA 5.5     Protein, UA  trace     Urobilinogen, UA 0.2     Nitrite, UA neg     Leukocytes, UA Trace    POCT UA - MICROSCOPIC ONLY      Result Value Range   WBC, Ur, HPF, POC 1-3     RBC, urine, microscopic neg     Bacteria, U Microscopic trace     Mucus, UA pos     Epithelial cells, urine per micros 3-5     Crystals, Ur, HPF, POC neg     Casts, Ur, LPF, POC neg     Yeast, UA neg     Amorphous pos      EKG in trigeminy, other wise normal, last ekg no pvcs    Assessment & Plan:  HTN controlled Refer for colonoscopy due to risk,cancer

## 2013-04-06 NOTE — Patient Instructions (Addendum)
DASH Diet The DASH diet stands for "Dietary Approaches to Stop Hypertension." It is a healthy eating plan that has been shown to reduce high blood pressure (hypertension) in as little as 14 days, while also possibly providing other significant health benefits. These other health benefits include reducing the risk of breast cancer after menopause and reducing the risk of type 2 diabetes, heart disease, colon cancer, and stroke. Health benefits also include weight loss and slowing kidney failure in patients with chronic kidney disease.  DIET GUIDELINES  Limit salt (sodium). Your diet should contain less than 1500 mg of sodium daily.  Limit refined or processed carbohydrates. Your diet should include mostly whole grains. Desserts and added sugars should be used sparingly.  Include small amounts of heart-healthy fats. These types of fats include nuts, oils, and tub margarine. Limit saturated and trans fats. These fats have been shown to be harmful in the body. CHOOSING FOODS  The following food groups are based on a 2000 calorie diet. See your Registered Dietitian for individual calorie needs. Grains and Grain Products (6 to 8 servings daily)  Eat More Often: Whole-wheat bread, brown rice, whole-grain or wheat pasta, quinoa, popcorn without added fat or salt (air popped).  Eat Less Often: White bread, white pasta, white rice, cornbread. Vegetables (4 to 5 servings daily)  Eat More Often: Fresh, frozen, and canned vegetables. Vegetables may be raw, steamed, roasted, or grilled with a minimal amount of fat.  Eat Less Often/Avoid: Creamed or fried vegetables. Vegetables in a cheese sauce. Fruit (4 to 5 servings daily)  Eat More Often: All fresh, canned (in natural juice), or frozen fruits. Dried fruits without added sugar. One hundred percent fruit juice ( cup [237 mL] daily).  Eat Less Often: Dried fruits with added sugar. Canned fruit in light or heavy syrup. [employer], Fish, and Poultry (2  servings or less daily. One serving is 3 to 4 oz [85-114 g]).  Eat More Often: Ninety percent or leaner ground beef, tenderloin, sirloin. Round cuts of beef, chicken breast, [place] breast. All fish. Grill, bake, or broil your meat. Nothing should be fried.  Eat Less Often/Avoid: Fatty cuts of meat, [place], or chicken leg, thigh, or wing. Fried cuts of meat or fish. Dairy (2 to 3 servings)  Eat More Often: Low-fat or fat-free milk, low-fat plain or light yogurt, reduced-fat or part-skim cheese.  Eat Less Often/Avoid: Milk (whole, 2%).Whole milk yogurt. Full-fat cheeses. Nuts, Seeds, and Legumes (4 to 5 servings per week)  Eat More Often: All without added salt.  Eat Less Often/Avoid: Salted nuts and seeds, canned beans with added salt. Fats and Sweets (limited)  Eat More Often: Vegetable oils, tub margarines without trans fats, sugar-free gelatin. Mayonnaise and salad dressings.  Eat Less Often/Avoid: Coconut oils, palm oils, butter, stick margarine, cream, half and half, cookies, candy, pie. FOR MORE INFORMATION The Dash Diet Eating Plan: www.dashdiet.org Document Released: 03/08/2011 Document Revised: 06/11/2011 Document Reviewed: 03/08/2011 [facility] Patient Information 2014 [place], [place]. Calorie Counting Diet A calorie counting diet requires you to eat the number of calories that are right for you in a day. Calories are the measurement of how much energy you get from the food you eat. Eating the right amount of calories is important for staying at a healthy weight. If you eat too many calories, your body will store them as fat and you may gain weight. If you eat too few calories, you may lose weight. Counting the number of calories you eat during  a day will help you know if you are eating the right amount. A Registered Dietitian can determine how many calories you need in a day. The amount of calories needed varies from person to person. If your goal is to lose weight, you will need  to eat fewer calories. Losing weight can benefit you if you are overweight or have health problems such as heart disease, high blood pressure, or diabetes. If your goal is to gain weight, you will need to eat more calories. Gaining weight may be necessary if you have a certain health problem that causes your body to need more energy. TIPS Whether you are increasing or decreasing the number of calories you eat during a day, it may be hard to get used to changes in what you eat and drink. The following are tips to help you keep track of the number of calories you eat.  Measure foods at home with measuring cups. This helps you know the amount of food and number of calories you are eating.  Restaurants often serve food in amounts that are larger than 1 serving. While eating out, estimate how many servings of a food you are given. For example, a serving of cooked rice is  cup or about the size of half of a fist. Knowing serving sizes will help you be aware of how much food you are eating at restaurants.  Ask for smaller portion sizes or child-size portions at restaurants.  Plan to eat half of a meal at a restaurant. Take the rest home or share the other half with a friend.  Read the Nutrition Facts panel on food labels for calorie content and serving size. You can find out how many servings are in a package, the size of a serving, and the number of calories each serving has.  For example, a package might contain 3 cookies. The Nutrition Facts panel on that package says that 1 serving is 1 cookie. Below that, it will say there are 3 servings in the container. The calories section of the Nutrition Facts label says there are 90 calories. This means there are 90 calories in 1 cookie (1 serving). If you eat 1 cookie you have eaten 90 calories. If you eat all 3 cookies, you have eaten 270 calories (3 servings x 90 calories = 270 calories). The list below tells you how big or small some common portion sizes  are.  1 oz.........4 stacked dice.  3 oz........Marland KitchenDeck of cards.  1 tsp.......Marland KitchenTip of little finger.  1 tbs......Marland KitchenMarland KitchenThumb.  2 tbs.......Marland KitchenGolf ball.   cup......Marland KitchenHalf of a fist.  1 cup.......Marland KitchenA fist. KEEP A FOOD LOG Write down every food item you eat, the amount you eat, and the number of calories in each food you eat during the day. At the end of the day, you can add up the total number of calories you have eaten. It may help to keep a list like the one below. Find out the calorie information by reading the Nutrition Facts panel on food labels. Breakfast  Bran cereal (1 cup, 110 calories).  Fat-free milk ( cup, 45 calories). Snack  Apple (1 medium, 80 calories). Lunch  Spinach (1 cup, 20 calories).  Tomato ( medium, 20 calories).  Chicken breast strips (3 oz, 165 calories).  Shredded cheddar cheese ( cup, 110 calories).  Light [place] dressing (2 tbs, 60 calories).  Whole-wheat bread (1 slice, 80 calories).  Tub margarine (1 tsp, 35 calories).  Vegetable soup (1 cup, 160 calories). Dinner  Pork chop (3 oz, 190 calories).  Brown rice (1 cup, 215 calories).  Steamed broccoli ( cup, 20 calories).  Strawberries (1  cup, 65 calories).  Whipped cream (1 tbs, 50 calories). Daily Calorie Total: 0383 Document Released: 03/19/2005 Document Revised: 06/11/2011 Document Reviewed: 09/13/2006 [facility] Patient Information 2014 [place].

## 2013-04-22 LAB — IFOBT (OCCULT BLOOD): IFOBT: NEGATIVE

## 2013-04-23 ENCOUNTER — Encounter: Payer: Self-pay | Admitting: Internal Medicine

## 2013-05-01 ENCOUNTER — Telehealth: Payer: Self-pay

## 2013-05-01 NOTE — Telephone Encounter (Signed)
Pt brought in dot/dmv paperwork for dr guest to fill out based on her last cpe. She stated he always does that for her. 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 Asked me to pull chart and put that with forms next to his laptop today (while hes seeing pts) and he will fill it out and contact pt when ready.  Pt asks [place] to fax ppwk and then call for pick up.   bf

## 2013-05-01 NOTE — Telephone Encounter (Signed)
Dr. Elder Cyphers has filled it out. Pt needs to fill out her section. It has been put up front for pt to pick up.

## 2013-05-13 ENCOUNTER — Ambulatory Visit (AMBULATORY_SURGERY_CENTER): Payer: Self-pay | Admitting: *Deleted

## 2013-05-13 VITALS — Ht 60.0 in | Wt 223.0 lb

## 2013-05-13 DIAGNOSIS — Z1211 Encounter for screening for malignant neoplasm of colon: Secondary | ICD-10-CM

## 2013-05-13 MED ORDER — MOVIPREP 100 G PO SOLR: ORAL | Status: DC

## 2013-05-13 NOTE — Progress Notes (Signed)
Patient denies any allergies to eggs or soy. Patient denies any problems with anesthesia.  

## 2013-05-14 ENCOUNTER — Encounter: Payer: Self-pay | Admitting: Internal Medicine

## 2013-06-05 ENCOUNTER — Encounter: Payer: Self-pay | Admitting: Internal Medicine

## 2013-06-05 ENCOUNTER — Ambulatory Visit (AMBULATORY_SURGERY_CENTER): Payer: BC Managed Care – PPO | Admitting: Internal Medicine

## 2013-06-05 VITALS — BP 147/77 | HR 68 | Temp 98.3°F | Resp 35 | Ht 60.0 in | Wt 223.0 lb

## 2013-06-05 DIAGNOSIS — D126 Benign neoplasm of colon, unspecified: Secondary | ICD-10-CM

## 2013-06-05 DIAGNOSIS — Z1211 Encounter for screening for malignant neoplasm of colon: Secondary | ICD-10-CM

## 2013-06-05 MED ORDER — SODIUM CHLORIDE 0.9 % IV SOLN: 500.0000 mL | INTRAVENOUS | Status: DC

## 2013-06-05 NOTE — Op Note (Signed)
[facility]  [address] [place], [address]   COLONOSCOPY PROCEDURE REPORT  PATIENT: Debra Mendoza, Debra Mendoza  MR#: [id number] BIRTHDATE: Aug 28, 1965 , 47  yrs. old GENDER: Female ENDOSCOPIST: Jerene Bears, MD REFERRED [id number] Guest, M.D. PROCEDURE DATE:  06/05/2013 PROCEDURE:   Colonoscopy with snare polypectomy First Screening Colonoscopy - Avg.  risk and is 50 yrs.  old or older Yes.  Prior Negative Screening - Now for repeat screening. N/A  History of Adenoma - Now for follow-up colonoscopy & has been > or = to 3 yrs.  N/A  Polyps Removed Today? Yes. ASA CLASS:   Class II INDICATIONS:average risk screening and first colonoscopy. MEDICATIONS: MAC sedation, administered by CRNA and propofol (Diprivan) 200mg  IV  DESCRIPTION OF PROCEDURE:   After the risks benefits and alternatives of the procedure were thoroughly explained, informed consent was obtained.  A digital rectal exam revealed no rectal mass.   The LB [id number]  endoscope was introduced through the anus and advanced to the cecum, which was identified by both the appendix and ileocecal valve. No adverse events experienced. The quality of the prep was good after irrigation and lavage, using MoviPrep  The instrument was then slowly withdrawn as the colon was fully examined.   COLON FINDINGS: Four sessile polyps measuring 4-5 mm in size were found at the hepatic flexure, in the descending colon, and sigmoid colon.  Polypectomy was performed using cold snare.  All resections were complete and all polyp tissue was completely retrieved.   The colon mucosa was otherwise normal.  Retroflexed views revealed small external hemorrhoids. The time to cecum=7 minutes 10 seconds. Withdrawal time=11 minutes 30 seconds.  The scope was withdrawn and the procedure completed. COMPLICATIONS: There were no complications.  ENDOSCOPIC IMPRESSION: 1.   Four sessile polyps measuring 4-5 mm in size were found at  the hepatic flexure, in the descending colon, and sigmoid colon; Polypectomy was performed using cold snare 2.   The colon mucosa was otherwise normal  RECOMMENDATIONS: 1.  Await pathology results 2.  Timing of repeat colonoscopy will be determined by pathology findings. 3.  You will receive a letter within 1-2 weeks with the results of your biopsy as well as final recommendations.  Please call my office if you have not received a letter after 3 weeks.   eSigned:  Jerene Bears, MD 06/05/2013 9:24 AM  cc: The Patient and Lou Miner, MD

## 2013-06-05 NOTE — Patient Instructions (Signed)
YOU HAD AN ENDOSCOPIC PROCEDURE TODAY AT THE [facility]: Refer to the procedure report that was given to you for any specific questions about what was found during the examination.  If the procedure report does not answer your questions, please call your gastroenterologist to clarify.  If you requested that your care partner not be given the details of your procedure findings, then the procedure report has been included in a sealed envelope for you to review at your convenience later.  YOU SHOULD EXPECT: Some feelings of bloating in the abdomen. Passage of more gas than usual.  Walking can help get rid of the air that was put into your GI tract during the procedure and reduce the bloating. If you had a lower endoscopy (such as a colonoscopy or flexible sigmoidoscopy) you may notice spotting of blood in your stool or on the toilet paper. If you underwent a bowel prep for your procedure, then you may not have a normal bowel movement for a few days.  DIET: Your first meal following the procedure should be a light meal and then it is ok to progress to your normal diet.  A half-sandwich or bowl of soup is an example of a good first meal.  Heavy or fried foods are harder to digest and may make you feel nauseous or bloated.  Likewise meals heavy in dairy and vegetables can cause extra gas to form and this can also increase the bloating.  Drink plenty of fluids but you should avoid alcoholic beverages for 24 hours.  ACTIVITY: Your care partner should take you home directly after the procedure.  You should plan to take it easy, moving slowly for the rest of the day.  You can resume normal activity the day after the procedure however you should NOT DRIVE or use heavy machinery for 24 hours (because of the sedation medicines used during the test).    SYMPTOMS TO REPORT IMMEDIATELY: A gastroenterologist can be reached at any hour.  During normal business hours, 8:30 AM to 5:00 PM Monday through Friday,  call [phone].  After hours and on weekends, please call the GI answering service at [phone] who will take a message and have the physician on call contact you.   Following lower endoscopy (colonoscopy or flexible sigmoidoscopy):  Excessive amounts of blood in the stool  Significant tenderness or worsening of abdominal pains  Swelling of the abdomen that is new, acute  Fever of 100F or higher    FOLLOW UP: If any biopsies were taken you will be contacted by phone or by letter within the next 1-3 weeks.  Call your gastroenterologist if you have not heard about the biopsies in 3 weeks.  Our staff will call the home number listed on your records the next business day following your procedure to check on you and address any questions or concerns that you may have at that time regarding the information given to you following your procedure. This is a courtesy call and so if there is no answer at the home number and we have not heard from you through the emergency physician on call, we will assume that you have returned to your regular daily activities without incident.  SIGNATURES/CONFIDENTIALITY: You and/or your care partner have signed paperwork which will be entered into your electronic medical record.  These signatures attest to the fact that that the information above on your After Visit Summary has been reviewed and is understood.  Full responsibility of the confidentiality   of this discharge information lies with you and/or your care-partner.   Polyp information given. Dr. Hilarie Fredrickson will advise you about recall after pathology results are reviewed.

## 2013-06-05 NOTE — Progress Notes (Signed)
Called to room to assist during endoscopic procedure.  Patient ID and intended procedure confirmed with present staff. Received instructions for my participation in the procedure from the performing physician.  

## 2013-06-05 NOTE — Progress Notes (Signed)
Stable to RR 

## 2013-06-05 NOTE — Progress Notes (Signed)
Patient denies any allergies to eggs or soy. Patient denies any problems with anesthesia.  

## 2013-06-08 ENCOUNTER — Telehealth: Payer: Self-pay

## 2013-06-08 NOTE — Telephone Encounter (Signed)
Left message on answering machine. 

## 2013-06-09 ENCOUNTER — Encounter: Payer: Self-pay | Admitting: Internal Medicine

## 2013-12-26 ENCOUNTER — Ambulatory Visit (INDEPENDENT_AMBULATORY_CARE_PROVIDER_SITE_OTHER): Payer: BC Managed Care – PPO

## 2013-12-26 DIAGNOSIS — Z23 Encounter for immunization: Secondary | ICD-10-CM

## 2014-04-20 ENCOUNTER — Ambulatory Visit (INDEPENDENT_AMBULATORY_CARE_PROVIDER_SITE_OTHER): Payer: BC Managed Care – PPO | Admitting: Internal Medicine

## 2014-04-20 ENCOUNTER — Encounter: Payer: Self-pay | Admitting: Family Medicine

## 2014-04-20 VITALS — BP 120/82 | HR 61 | Temp 99.0°F | Resp 16 | Ht 61.0 in | Wt 223.4 lb

## 2014-04-20 DIAGNOSIS — I10 Essential (primary) hypertension: Secondary | ICD-10-CM

## 2014-04-20 DIAGNOSIS — Z7189 Other specified counseling: Secondary | ICD-10-CM

## 2014-04-20 LAB — COMPREHENSIVE METABOLIC PANEL
ALK PHOS: 19 U/L — AB (ref 39–117)
ALT: 23 U/L (ref 0–35)
AST: 21 U/L (ref 0–37)
Albumin: 4.7 g/dL (ref 3.5–5.2)
BILIRUBIN TOTAL: 1 mg/dL (ref 0.2–1.2)
BUN: 11 mg/dL (ref 6–23)
CO2: 25 mEq/L (ref 19–32)
Calcium: 10.1 mg/dL (ref 8.4–10.5)
Chloride: 99 mEq/L (ref 96–112)
Creat: 0.98 mg/dL (ref 0.50–1.10)
Glucose, Bld: 90 mg/dL (ref 70–99)
POTASSIUM: 4.3 meq/L (ref 3.5–5.3)
Sodium: 136 mEq/L (ref 135–145)
Total Protein: 8.1 g/dL (ref 6.0–8.3)

## 2014-04-20 LAB — LIPID PANEL
CHOL/HDL RATIO: 6.5 ratio
Cholesterol: 183 mg/dL (ref 0–200)
HDL: 28 mg/dL — ABNORMAL LOW (ref >39)
LDL Cholesterol: 138 mg/dL — ABNORMAL HIGH (ref 0–99)
Triglycerides: 86 mg/dL (ref <150)
VLDL: 17 mg/dL (ref 0–40)

## 2014-04-20 LAB — POCT UA - MICROSCOPIC ONLY
Bacteria, U Microscopic: NEGATIVE
Casts, Ur, LPF, POC: NEGATIVE
Crystals, Ur, HPF, POC: NEGATIVE
Mucus, UA: NEGATIVE
RBC, urine, microscopic: NEGATIVE
YEAST UA: NEGATIVE

## 2014-04-20 LAB — TSH: TSH: 1.772 u[IU]/mL (ref 0.350–4.500)

## 2014-04-20 LAB — GLUCOSE, POCT (MANUAL RESULT ENTRY): POC GLUCOSE: 102 mg/dL — AB (ref 70–99)

## 2014-04-20 MED ORDER — LISINOPRIL-HYDROCHLOROTHIAZIDE 20-25 MG PO TABS: 1.0000 | ORAL_TABLET | Freq: Every day | ORAL | Status: DC

## 2014-04-20 NOTE — Progress Notes (Signed)
Subjective:    Patient ID: Debra Mendoza, female    DOB: 20-Jul-1965, 49 y.o.   MRN: [id number]  HPI HTN controlled, meds tolerated, needs rfs. Will schedule complete CPE in next 2-3 months.    Review of Systems     Objective:   Physical Exam  Constitutional: She is oriented to person, place, and time. She appears well-nourished.  HENT:  Head: Normocephalic.  Mouth/Throat: Oropharynx is clear and moist.  Eyes: Conjunctivae and EOM are normal. Pupils are equal, round, and reactive to light.  Neck: Normal range of motion.  Cardiovascular: Normal rate, regular rhythm and normal heart sounds.   Pulmonary/Chest: Effort normal and breath sounds normal.  Musculoskeletal: Normal range of motion.  Neurological: She is alert and oriented to person, place, and time. She exhibits normal muscle tone. Coordination normal.  Psychiatric: She has a normal mood and affect.  Vitals reviewed.    Results for orders placed or performed in visit on 04/06/13  Comprehensive metabolic panel  Result Value Ref Range   Sodium 135 135 - 145 mEq/L   Potassium 3.8 3.5 - 5.3 mEq/L   Chloride 100 96 - 112 mEq/L   CO2 23 19 - 32 mEq/L   Glucose, Bld 101 (H) 70 - 99 mg/dL   BUN 13 6 - 23 mg/dL   Creat 1.05 0.50 - 1.10 mg/dL   Total Bilirubin 1.2 0.3 - 1.2 mg/dL   Alkaline Phosphatase 17 (L) 39 - 117 U/L   AST 20 0 - 37 U/L   ALT 19 0 - 35 U/L   Total Protein 7.9 6.0 - 8.3 g/dL   Albumin 4.5 3.5 - 5.2 g/dL   Calcium 10.2 8.4 - 10.5 mg/dL  TSH  Result Value Ref Range   TSH 4.311 0.350 - 4.500 uIU/mL  Lipid panel  Result Value Ref Range   Cholesterol 210 (H) 0 - 200 mg/dL   Triglycerides 106 <150 mg/dL   HDL 29 (L) >39 mg/dL   Total CHOL/HDL Ratio 7.2 Ratio   VLDL 21 0 - 40 mg/dL   LDL Cholesterol 160 (H) 0 - 99 mg/dL  POCT CBC  Result Value Ref Range   WBC 7.0 4.6 - 10.2 K/uL   Lymph, poc 3.5 (A) 0.6 - 3.4   POC LYMPH PERCENT 49.8 10 - 50 %L   MID (cbc) 0.5 0 - 0.9   POC MID % 7.3 0 -  12 %M   POC Granulocyte 3.0 2 - 6.9   Granulocyte percent 42.9 37 - 80 %G   RBC 5.68 (A) 4.04 - 5.48 M/uL   Hemoglobin 16.5 (A) 12.2 - 16.2 g/dL   HCT, POC 51.5 (A) 37.7 - 47.9 %   MCV 90.7 80 - 97 fL   MCH, POC 29.0 27 - 31.2 pg   MCHC 32.0 31.8 - 35.4 g/dL   RDW, POC 14.6 %   Platelet Count, POC 158 142 - 424 K/uL   MPV 11.8 0 - 99.8 fL  POCT glucose (manual entry)  Result Value Ref Range   POC Glucose 5.6 (A) 70 - 99 mg/dl  POCT glycosylated hemoglobin (Hb A1C)  Result Value Ref Range   Hemoglobin A1C 5.6   POCT urinalysis dipstick  Result Value Ref Range   Color, UA yellow    Clarity, UA cloudy    Glucose, UA neg    Bilirubin, UA neg    Ketones, UA neg    Spec Grav, UA 1.015    Blood, UA  neg    pH, UA 5.5    Protein, UA trace    Urobilinogen, UA 0.2    Nitrite, UA neg    Leukocytes, UA Trace   POCT UA - Microscopic Only  Result Value Ref Range   WBC, Ur, HPF, POC 1-3    RBC, urine, microscopic neg    Bacteria, U Microscopic trace    Mucus, UA pos    Epithelial cells, urine per micros 3-5    Crystals, Ur, HPF, POC neg    Casts, Ur, LPF, POC neg    Yeast, UA neg    Amorphous pos   IFOBT POC (occult bld, rslt in office)  Result Value Ref Range   IFOBT Negative         Assessment & Plan:  HTN controlled RF meds Schedule cpe 104

## 2014-04-20 NOTE — Patient Instructions (Signed)
DASH Eating Plan °DASH stands for "Dietary Approaches to Stop Hypertension." The DASH eating plan is a healthy eating plan that has been shown to reduce high blood pressure (hypertension). Additional health benefits may include reducing the risk of type 2 diabetes mellitus, heart disease, and stroke. The DASH eating plan may also help with weight loss. °WHAT DO I NEED TO KNOW ABOUT THE DASH EATING PLAN? °For the DASH eating plan, you will follow these general guidelines: °· Choose foods with a percent daily value for sodium of less than 5% (as listed on the food label). °· Use salt-free seasonings or herbs instead of table salt or sea salt. °· Check with your health care provider or pharmacist before using salt substitutes. °· Eat lower-sodium products, often labeled as "lower sodium" or "no salt added." °· Eat fresh foods. °· Eat more vegetables, fruits, and low-fat dairy products. °· Choose whole grains. Look for the word "whole" as the first word in the ingredient list. °· Choose fish and skinless chicken or turkey more often than red meat. Limit fish, poultry, and meat to 6 oz (170 g) each day. °· Limit sweets, desserts, sugars, and sugary drinks. °· Choose heart-healthy fats. °· Limit cheese to 1 oz (28 g) per day. °· Eat more home-cooked food and less restaurant, buffet, and fast food. °· Limit fried foods. °· Cook foods using methods other than frying. °· Limit canned vegetables. If you do use them, rinse them well to decrease the sodium. °· When eating at a restaurant, ask that your food be prepared with less salt, or no salt if possible. °WHAT FOODS CAN I EAT? °Seek help from a dietitian for individual calorie needs. °Grains °Whole grain or whole wheat bread. Brown rice. Whole grain or whole wheat pasta. Quinoa, bulgur, and whole grain cereals. Low-sodium cereals. Corn or whole wheat flour tortillas. Whole grain cornbread. Whole grain crackers. Low-sodium crackers. °Vegetables °Fresh or frozen vegetables  (raw, steamed, roasted, or grilled). Low-sodium or reduced-sodium tomato and vegetable juices. Low-sodium or reduced-sodium tomato sauce and paste. Low-sodium or reduced-sodium canned vegetables.  °Fruits °All fresh, canned (in natural juice), or frozen fruits. °Meat and Other Protein Products °Ground beef (85% or leaner), grass-fed beef, or beef trimmed of fat. Skinless chicken or turkey. Ground chicken or turkey. Pork trimmed of fat. All fish and seafood. Eggs. Dried beans, peas, or lentils. Unsalted nuts and seeds. Unsalted canned beans. °Dairy °Low-fat dairy products, such as skim or 1% milk, 2% or reduced-fat cheeses, low-fat ricotta or cottage cheese, or plain low-fat yogurt. Low-sodium or reduced-sodium cheeses. °Fats and Oils °Tub margarines without trans fats. Light or reduced-fat mayonnaise and salad dressings (reduced sodium). Avocado. Safflower, olive, or canola oils. Natural peanut or almond butter. °Other °Unsalted popcorn and pretzels. °The items listed above may not be a complete list of recommended foods or beverages. Contact your dietitian for more options. °WHAT FOODS ARE NOT RECOMMENDED? °Grains °White bread. White pasta. White rice. Refined cornbread. Bagels and croissants. Crackers that contain trans fat. °Vegetables °Creamed or fried vegetables. Vegetables in a cheese sauce. Regular canned vegetables. Regular canned tomato sauce and paste. Regular tomato and vegetable juices. °Fruits °Dried fruits. Canned fruit in light or heavy syrup. Fruit juice. °Meat and Other Protein Products °Fatty cuts of meat. Ribs, chicken wings, bacon, sausage, bologna, salami, chitterlings, fatback, hot dogs, bratwurst, and packaged luncheon meats. Salted nuts and seeds. Canned beans with salt. °Dairy °Whole or 2% milk, cream, half-and-half, and cream cheese. Whole-fat or sweetened yogurt. Full-fat   cheeses or blue cheese. Nondairy creamers and whipped toppings. Processed cheese, cheese spreads, or cheese  curds. °Condiments °Onion and garlic salt, seasoned salt, table salt, and sea salt. Canned and packaged gravies. Worcestershire sauce. Tartar sauce. Barbecue sauce. Teriyaki sauce. Soy sauce, including reduced sodium. Steak sauce. Fish sauce. Oyster sauce. Cocktail sauce. Horseradish. Ketchup and mustard. Meat flavorings and tenderizers. Bouillon cubes. Hot sauce. Tabasco sauce. Marinades. Taco seasonings. Relishes. °Fats and Oils °Butter, stick margarine, lard, shortening, ghee, and bacon fat. Coconut, palm kernel, or palm oils. Regular salad dressings. °Other °Pickles and olives. Salted popcorn and pretzels. °The items listed above may not be a complete list of foods and beverages to avoid. Contact your dietitian for more information. °WHERE CAN I FIND MORE INFORMATION? °National [employer], [employer], [employer]: www.nhlbi.nih.gov/health/health-topics/topics/dash/ °Document Released: 03/08/2011 Document Revised: 08/03/2013 Document Reviewed: 01/21/2013 °[employer]® Patient Information ©2015 [employer]. This information is not intended to replace advice given to you by your health care provider. Make sure you discuss any questions you have with your health care provider. ° °

## 2014-05-31 ENCOUNTER — Ambulatory Visit: Payer: BC Managed Care – PPO | Admitting: Family Medicine

## 2014-11-04 ENCOUNTER — Encounter: Payer: Self-pay | Admitting: Urgent Care

## 2014-11-04 ENCOUNTER — Ambulatory Visit (INDEPENDENT_AMBULATORY_CARE_PROVIDER_SITE_OTHER): Payer: BC Managed Care – PPO | Admitting: Urgent Care

## 2014-11-04 VITALS — BP 140/92 | HR 77 | Temp 99.1°F | Resp 16 | Ht 60.0 in | Wt 225.0 lb

## 2014-11-04 DIAGNOSIS — R202 Paresthesia of skin: Secondary | ICD-10-CM | POA: Diagnosis not present

## 2014-11-04 DIAGNOSIS — R200 Anesthesia of skin: Secondary | ICD-10-CM | POA: Insufficient documentation

## 2014-11-04 DIAGNOSIS — Z0000 Encounter for general adult medical examination without abnormal findings: Secondary | ICD-10-CM

## 2014-11-04 DIAGNOSIS — I10 Essential (primary) hypertension: Secondary | ICD-10-CM | POA: Diagnosis not present

## 2014-11-04 DIAGNOSIS — E669 Obesity, unspecified: Secondary | ICD-10-CM

## 2014-11-04 LAB — CBC
HEMATOCRIT: 45 % (ref 36.0–46.0)
HEMOGLOBIN: 15.4 g/dL — AB (ref 12.0–15.0)
MCH: 28.9 pg (ref 26.0–34.0)
MCHC: 34.2 g/dL (ref 30.0–36.0)
MCV: 84.4 fL (ref 78.0–100.0)
MPV: 10.7 fL (ref 8.6–12.4)
Platelets: 242 10*3/uL (ref 150–400)
RBC: 5.33 MIL/uL — ABNORMAL HIGH (ref 3.87–5.11)
RDW: 14.4 % (ref 11.5–15.5)
WBC: 6.4 10*3/uL (ref 4.0–10.5)

## 2014-11-04 LAB — COMPREHENSIVE METABOLIC PANEL
ALT: 30 U/L — AB (ref 6–29)
AST: 25 U/L (ref 10–35)
Albumin: 4.4 g/dL (ref 3.6–5.1)
Alkaline Phosphatase: 16 U/L — ABNORMAL LOW (ref 33–115)
BUN: 10 mg/dL (ref 7–25)
CALCIUM: 9.8 mg/dL (ref 8.6–10.2)
CHLORIDE: 100 mmol/L (ref 98–110)
CO2: 23 mmol/L (ref 20–31)
Creat: 0.93 mg/dL (ref 0.50–1.10)
Glucose, Bld: 78 mg/dL (ref 65–99)
POTASSIUM: 4 mmol/L (ref 3.5–5.3)
SODIUM: 137 mmol/L (ref 135–146)
TOTAL PROTEIN: 7.7 g/dL (ref 6.1–8.1)
Total Bilirubin: 1 mg/dL (ref 0.2–1.2)

## 2014-11-04 LAB — LIPID PANEL
CHOL/HDL RATIO: 6.9 ratio — AB (ref <=5.0)
Cholesterol: 180 mg/dL (ref 125–200)
HDL: 26 mg/dL — ABNORMAL LOW (ref >=46)
LDL Cholesterol: 135 mg/dL — ABNORMAL HIGH (ref <130)
Triglycerides: 95 mg/dL (ref <150)
VLDL: 19 mg/dL (ref <30)

## 2014-11-04 LAB — POCT GLYCOSYLATED HEMOGLOBIN (HGB A1C): HEMOGLOBIN A1C: 6

## 2014-11-04 MED ORDER — LISINOPRIL-HYDROCHLOROTHIAZIDE 20-25 MG PO TABS: 1.0000 | ORAL_TABLET | Freq: Every day | ORAL | Status: DC

## 2014-11-04 NOTE — Patient Instructions (Addendum)
Keeping You Healthy  Get These Tests  Blood Pressure- Have your blood pressure checked once a year by your health care provider.  Normal blood pressure is 120/80.  Weight- Have your body mass index (BMI) calculated to screen for obesity.  BMI is measure of body fat based on height and weight.  You can also calculate your own BMI at GravelBags.it.  Cholesterol- Have your cholesterol checked every 5 years starting at age 48 then yearly starting at age 62.  Chlamydia, HIV, and other sexually transmitted diseases- Get screened every year until age 57, then within three months of each new sexual provider.  Pap Test - Every 1-5 years; discuss with your health care provider.  Mammogram- Every 1-2 years starting at age 63--50  Take these medicines  Calcium with Vitamin D-Your body needs 1200 mg of Calcium each day and [phone] IU of Vitamin D daily.  Your body can only absorb 500 mg of Calcium at a time so Calcium must be taken in 2 or 3 divided doses throughout the day.  Multivitamin with folic acid- Once daily if it is possible for you to become pregnant.  Get these Immunizations  Gardasil-Series of three doses; prevents HPV related illness such as genital warts and cervical cancer.  Menactra-Single dose; prevents meningitis.  Tetanus shot- Every 10 years.  Flu shot-Every year.  Take these steps  Do not smoke-Your healthcare provider can help you quit.  For tips on how to quit go to www.smokefree.gov or call 1-800 QUITNOW.  Be physically active- Exercise 5 days a week for at least 30 minutes.  If you are not already physically active, start slow and gradually work up to 30 minutes of moderate physical activity.  Examples of moderate activity include walking briskly, dancing, swimming, bicycling, etc.  Breast Cancer- A self breast exam every month is important for early detection of breast cancer.  For more information and instruction on self breast exams, ask your  healthcare provider or https://www.patel.info/.  Eat a healthy diet- Eat a variety of healthy foods such as fruits, vegetables, whole grains, low fat milk, low fat cheeses, yogurt, lean meats, poultry and fish, beans, nuts, tofu, etc.  For more information go to www. Thenutritionsource.org  Drink alcohol in moderation- Limit alcohol intake to one drink or less per day. Never drink and drive.  Depression- Your emotional health is as important as your physical health.  If you're feeling down or losing interest in things you normally enjoy please talk to your healthcare provider about being screened for depression.  Dental visit- Brush and floss your teeth twice daily; visit your dentist twice a year.  Eye doctor- Get an eye exam at least every 2 years.  Helmet use- Always wear a helmet when riding a bicycle, motorcycle, rollerblading or skateboarding.  Safe sex- If you may be exposed to sexually transmitted infections, use a condom.  Seat belts- Seat belts can save your live; always wear one.  Smoke/Carbon Monoxide detectors- These detectors need to be installed on the appropriate level of your home. Replace batteries at least once a year.  Skin cancer- When out in the sun please cover up and use sunscreen 15 SPF or higher.  Violence- If anyone is threatening or hurting you, please tell your healthcare provider.    Obesity Obesity is defined as having too much total body fat and a body mass index (BMI) of 30 or more. BMI is an estimate of body fat and is calculated from your height and  weight. Obesity happens when you consume more calories than you can burn by exercising or performing daily physical tasks. Prolonged obesity can cause major illnesses or emergencies, such as:   Stroke.  Heart disease.  Diabetes.  Cancer.  Arthritis.  High blood pressure (hypertension).  High cholesterol.  Sleep apnea.  Erectile dysfunction.  Infertility  problems. CAUSES   Regularly eating unhealthy foods.  Physical inactivity.  Certain disorders, such as an underactive thyroid (hypothyroidism), Cushing's syndrome, and polycystic ovarian syndrome.  Certain medicines, such as steroids, some depression medicines, and antipsychotics.  Genetics.  Lack of sleep. DIAGNOSIS  A health care provider can diagnose obesity after calculating your BMI. Obesity will be diagnosed if your BMI is 30 or higher.  There are other methods of measuring obesity levels. Some other methods include measuring your skinfold thickness, your waist circumference, and comparing your hip circumference to your waist circumference. TREATMENT  A healthy treatment program includes some or all of the following:  Long-term dietary changes.  Exercise and physical activity.  Behavioral and lifestyle changes.  Medicine only under the supervision of your health care provider. Medicines may help, but only if they are used with diet and exercise programs. An unhealthy treatment program includes:  Fasting.  Fad diets.  Supplements and drugs. These choices do not succeed in long-term weight control.  HOME CARE INSTRUCTIONS   Exercise and perform physical activity as directed by your health care provider. To increase physical activity, try the following:  Use stairs instead of elevators.  Park farther away from store entrances.  Garden, bike, or walk instead of watching television or using the computer.  Eat healthy, low-calorie foods and drinks on a regular basis. Eat more fruits and vegetables. Use low-calorie cookbooks or take healthy cooking classes.  Limit fast food, sweets, and processed snack foods.  Eat smaller portions.  Keep a daily journal of everything you eat. There are many free websites to help you with this. It may be helpful to measure your foods so you can determine if you are eating the correct portion sizes.  Avoid drinking alcohol. Drink  more water and drinks without calories.  Take vitamins and supplements only as recommended by your health care provider.  Weight-loss support groups, Tax adviser, counselors, and stress reduction education can also be very helpful. SEEK IMMEDIATE MEDICAL CARE IF:  You have chest pain or tightness.  You have trouble breathing or feel short of breath.  You have weakness or leg numbness.  You feel confused or have trouble talking.  You have sudden changes in your vision. MAKE SURE YOU:  Understand these instructions.  Will watch your condition.  Will get help right away if you are not doing well or get worse. Document Released: 04/26/2004 Document Revised: 08/03/2013 Document Reviewed: 04/25/2011 [facility] Patient Information 2015 [place], [place]. This information is not intended to replace advice given to you by your health care provider. Make sure you discuss any questions you have with your health care provider.   Managing Your High Blood Pressure Blood pressure is a measurement of how forceful your blood is pressing against the walls of the arteries. Arteries are muscular tubes within the circulatory system. Blood pressure does not stay the same. Blood pressure rises when you are active, excited, or nervous; and it lowers during sleep and relaxation. If the numbers measuring your blood pressure stay above normal most of the time, you are at risk for health problems. High blood pressure (hypertension) is a long-term (chronic) condition  in which blood pressure is elevated. A blood pressure reading is recorded as two numbers, such as 120 over 80 (or 120/80). The first, higher number is called the systolic pressure. It is a measure of the pressure in your arteries as the heart beats. The second, lower number is called the diastolic pressure. It is a measure of the pressure in your arteries as the heart relaxes between beats.  Keeping your blood pressure in a normal range is  important to your overall health and prevention of health problems, such as heart disease and stroke. When your blood pressure is uncontrolled, your heart has to work harder than normal. High blood pressure is a very common condition in adults because blood pressure tends to rise with age. Men and women are equally likely to have hypertension but at different times in life. Before age 51, men are more likely to have hypertension. After 48 years of age, women are more likely to have it. Hypertension is especially common in African Americans. This condition often has no signs or symptoms. The cause of the condition is usually not known. Your caregiver can help you come up with a plan to keep your blood pressure in a normal, healthy range. BLOOD PRESSURE STAGES Blood pressure is classified into four stages: normal, prehypertension, stage 1, and stage 2. Your blood pressure reading will be used to determine what type of treatment, if any, is necessary. Appropriate treatment options are tied to these four stages:  Normal  Systolic pressure (mm Hg): below 120.  Diastolic pressure (mm Hg): below 80. Prehypertension  Systolic pressure (mm Hg): 120 to 139.  Diastolic pressure (mm Hg): 80 to 89. Stage1  Systolic pressure (mm Hg): 140 to 159.  Diastolic pressure (mm Hg): 90 to 99. Stage2  Systolic pressure (mm Hg): 160 or above.  Diastolic pressure (mm Hg): 100 or above. RISKS RELATED TO HIGH BLOOD PRESSURE Managing your blood pressure is an important responsibility. Uncontrolled high blood pressure can lead to:  A heart attack.  A stroke.  A weakened blood vessel (aneurysm).  Heart failure.  Kidney damage.  Eye damage.  Metabolic syndrome.  Memory and concentration problems. HOW TO MANAGE YOUR BLOOD PRESSURE Blood pressure can be managed effectively with lifestyle changes and medicines (if needed). Your caregiver will help you come up with a plan to bring your blood pressure within  a normal range. Your plan should include the following: Education  Read all information provided by your caregivers about how to control blood pressure.  Educate yourself on the latest guidelines and treatment recommendations. New research is always being done to further define the risks and treatments for high blood pressure. Lifestylechanges  Control your weight.  Avoid smoking.  Stay physically active.  Reduce the amount of salt in your diet.  Reduce stress.  Control any chronic conditions, such as high cholesterol or diabetes.  Reduce your alcohol intake. Medicines  Several medicines (antihypertensive medicines) are available, if needed, to bring blood pressure within a normal range. Communication  Review all the medicines you take with your caregiver because there may be side effects or interactions.  Talk with your caregiver about your diet, exercise habits, and other lifestyle factors that may be contributing to high blood pressure.  See your caregiver regularly. Your caregiver can help you create and adjust your plan for managing high blood pressure. RECOMMENDATIONS FOR TREATMENT AND FOLLOW-UP  The following recommendations are based on current guidelines for managing high blood pressure in nonpregnant adults. Use  these recommendations to identify the proper follow-up period or treatment option based on your blood pressure reading. You can discuss these options with your caregiver.  Systolic pressure of 621 to 308 or diastolic pressure of 80 to 89: Follow up with your caregiver as directed.  Systolic pressure of 657 to 846 or diastolic pressure of 90 to 100: Follow up with your caregiver within 2 months.  Systolic pressure above 962 or diastolic pressure above 952: Follow up with your caregiver within 1 month.  Systolic pressure above 841 or diastolic pressure above 324: Consider antihypertensive therapy; follow up with your caregiver within 1 week.  Systolic  pressure above 401 or diastolic pressure above 027: Begin antihypertensive therapy; follow up with your caregiver within 1 week. Document Released: 12/12/2011 Document Reviewed: 12/12/2011 [facility] Patient Information 2015 [place]. This information is not intended to replace advice given to you by your health care provider. Make sure you discuss any questions you have with your health care provider.

## 2014-11-04 NOTE — Progress Notes (Signed)
MRN: [id number]  Subjective:   [name] is a 49 y.o. female presenting for annual physical exam.  Medical care team includes: PCP: Kennon Portela, MD Vision: No visual deficits Dental: Cleanings every 6 months OB/GYN: Last pap smear performed 09/2014 and was normal. Mammography done in 08/2013 and was normal, scheduled for repeat 11/05/2014 Specialists: GI, colonoscopy performed 2015 and was normal, patient to f/u in 54 years    [name] has HTN (hypertension) and Obesity on her problem list.  HTN - takes lis-HCT daily and checks her BP regularly. States that it normally runs 462'V systolic. Diet is non-compliant, does not exercise. Works as a Teacher, early years/pre. Denies chest pain, heart racing, chronic headache, double vision, hematuria, lower leg swelling.  Obesity - patient is requesting a weight loss pill. States that she has "always had difficulty weight". However, she has not made changes to her diet or started exercising. Her TSH levels have been normal 1 year and 6 months ago.  Numbness and tingling - reports occasional focal numbness and tingling of the lateral part of left knee elicited with standing for long periods or while sitting for a long time. Denies decreased ROM, swelling, pain, redness, radiation of pain, history of arthritis and knee surgery.   Debra Mendoza has a current medication list which includes the following prescription(s): vitamin c, calcium carbonate-vitamin d, lisinopril-hydrochlorothiazide, norethindrone, omega-3 fatty acids, potassium chloride, biotin, and misc natural products. She has No Known Allergies.  Debra Mendoza  has a past medical history of Hypertension. Also  has past surgical history that includes Myomectomy and Novasure ablation.  family history includes Heart disease in her father; Hypertension in her father, maternal grandmother, and mother. There is no history of Colon cancer.  Immunizations: last TDAP  12/31/2011  ROS  Objective:   Vitals: BP 140/92 mmHg  Pulse 77  Temp(Src) 99.1 F (37.3 C)  Resp 16  Ht 5' (1.524 m)  Wt 225 lb (102.059 kg)  BMI 43.94 kg/m2  LMP 09/04/2014  Physical Exam  Constitutional: She is oriented to person, place, and time. She appears well-developed and well-nourished.  Body habitus is obese.  HENT:  TM's intact bilaterally, no effusions or erythema. Nares patent, nasal turbinates pink and moist. No sinus tenderness. Oropharynx clear, mucous membranes moist, dentition in good repair.  Eyes: Conjunctivae and EOM are normal. Pupils are equal, round, and reactive to light. Right eye exhibits no discharge. Left eye exhibits no discharge. No scleral icterus.  Neck: Normal range of motion. Neck supple. No thyromegaly present.  Cardiovascular: Normal rate, regular rhythm and intact distal pulses.  Exam reveals no gallop and no friction rub.   No murmur heard. Pulmonary/Chest: No respiratory distress. She has no wheezes. She has no rales.  Abdominal: Soft. Bowel sounds are normal. She exhibits no distension and no mass. There is no tenderness.  Genitourinary:  Defer to her gynecologist.  Musculoskeletal: Normal range of motion. She exhibits no edema or tenderness.       Left knee: She exhibits normal range of motion, no swelling, no effusion, no ecchymosis, no deformity, no laceration, no erythema, normal alignment, no LCL laxity, normal patellar mobility and no bony tenderness. No tenderness found.  Lymphadenopathy:    She has no cervical adenopathy.  Neurological: She is alert and oriented to person, place, and time. She has normal reflexes.  Skin: Skin is warm and dry. No rash noted. No erythema. No pallor.  Psychiatric: She has a normal mood and affect.  Results for orders placed or performed in visit on 11/04/14 (from the past 24 hour(s))  POCT glycosylated hemoglobin (Hb A1C)     Status: None   Collection Time: 11/04/14  3:37 PM  Result Value Ref  Range   Hemoglobin A1C 6.0    Assessment and Plan :   1. Physical exam - Discussed healthy lifestyle, diet, exercise, preventative care, vaccinations, and addressed patient's concerns.  - Lipid panel - CBC - Comprehensive metabolic panel  2. Essential hypertension - Stable, patient is not willing to add another medication despite non-compliance. Discussed risks of uncontrolled HTN. Refills provided, consider adding 3rd BP med if her BP remains uncontrolled. - lisinopril-hydrochlorothiazide (PRINZIDE,ZESTORETIC) 20-25 MG per tablet; Take 1 tablet by mouth daily.  Dispense: 90 tablet; Refill: 3  3. Obesity - Continue efforts at weight loss through dietary modifications and exercise. Declined to start weight loss medication. - POCT glycosylated hemoglobin (Hb A1C)  4. Numbness and tingling of left leg - May be related to deconditioning and obesity, patient declined starting medications for now. Consider physical therapy in the future if this problem persists. Recheck in 6 months. - POCT glycosylated hemoglobin (Hb A1C)   Debra Eagles, PA-C [facility] [phone] 11/04/2014  2:35 PM

## 2014-11-12 ENCOUNTER — Telehealth: Payer: Self-pay | Admitting: Urgent Care

## 2014-11-12 NOTE — Telephone Encounter (Signed)
-----   Message from [name] sent at 11/12/2014 11:26 AM EDT ----- Spoke with patient, notified and voiced understanding. She does not want to take a cholesterol medication at this point. She wanted to try working on diet/exercise, fish oil (omega-3 fatty acids), increasing intake of better food choices that might help increase HDL and decrease LDL. In general just be more mindful of types of foods and nutritional values.

## 2014-11-12 NOTE — Telephone Encounter (Signed)
Thank you, Guera.

## 2014-12-24 ENCOUNTER — Encounter: Payer: Self-pay | Admitting: Family Medicine

## 2015-01-27 ENCOUNTER — Ambulatory Visit (INDEPENDENT_AMBULATORY_CARE_PROVIDER_SITE_OTHER): Payer: BC Managed Care – PPO

## 2015-01-27 DIAGNOSIS — Z23 Encounter for immunization: Secondary | ICD-10-CM | POA: Diagnosis not present

## 2015-09-15 ENCOUNTER — Ambulatory Visit (INDEPENDENT_AMBULATORY_CARE_PROVIDER_SITE_OTHER): Payer: BC Managed Care – PPO | Admitting: Urgent Care

## 2015-09-15 ENCOUNTER — Ambulatory Visit (INDEPENDENT_AMBULATORY_CARE_PROVIDER_SITE_OTHER): Payer: BC Managed Care – PPO

## 2015-09-15 VITALS — BP 143/96 | HR 89 | Temp 98.4°F | Resp 18 | Ht 60.0 in | Wt 247.0 lb

## 2015-09-15 DIAGNOSIS — R05 Cough: Secondary | ICD-10-CM

## 2015-09-15 DIAGNOSIS — R059 Cough, unspecified: Secondary | ICD-10-CM

## 2015-09-15 DIAGNOSIS — I10 Essential (primary) hypertension: Secondary | ICD-10-CM

## 2015-09-15 DIAGNOSIS — R062 Wheezing: Secondary | ICD-10-CM

## 2015-09-15 LAB — POCT CBC
GRANULOCYTE PERCENT: 54.3 % (ref 37–80)
HEMATOCRIT: 44.6 % (ref 37.7–47.9)
HEMOGLOBIN: 15.6 g/dL (ref 12.2–16.2)
LYMPH, POC: 2.5 (ref 0.6–3.4)
MCH, POC: 29 pg (ref 27–31.2)
MCHC: 35 g/dL (ref 31.8–35.4)
MCV: 83 fL (ref 80–97)
MID (cbc): 0.4 (ref 0–0.9)
MPV: 9.3 fL (ref 0–99.8)
POC GRANULOCYTE: 3.4 (ref 2–6.9)
POC LYMPH %: 38.9 % (ref 10–50)
POC MID %: 6.8 % (ref 0–12)
Platelet Count, POC: 196 10*3/uL (ref 142–424)
RBC: 5.37 M/uL (ref 4.04–5.48)
RDW, POC: 13.8 %
WBC: 6.3 10*3/uL (ref 4.6–10.2)

## 2015-09-15 LAB — BRAIN NATRIURETIC PEPTIDE

## 2015-09-15 MED ORDER — AMLODIPINE BESYLATE 5 MG PO TABS: 5.0000 mg | ORAL_TABLET | Freq: Every day | ORAL | Status: DC

## 2015-09-15 MED ORDER — ALBUTEROL SULFATE HFA 108 (90 BASE) MCG/ACT IN AERS: 2.0000 | INHALATION_SPRAY | Freq: Two times a day (BID) | RESPIRATORY_TRACT | Status: DC | PRN

## 2015-09-15 NOTE — Patient Instructions (Addendum)
Check your blood pressure 2-3 times per week. If your blood pressure remains greater than [id number] systolic (top #) or greater 90 diastolic (bottom #), then start amlodipine once daily. If you do start this blood pressure medication, come back in 4 weeks for a recheck.   Hypertension Hypertension, commonly called high blood pressure, is when the force of blood pumping through your arteries is too strong. Your arteries are the blood vessels that carry blood from your heart throughout your body. A blood pressure reading consists of a higher number over a lower number, such as 110/72. The higher number (systolic) is the pressure inside your arteries when your heart pumps. The lower number (diastolic) is the pressure inside your arteries when your heart relaxes. Ideally you want your blood pressure below 120/80. Hypertension forces your heart to work harder to pump blood. Your arteries may become narrow or stiff. Having untreated or uncontrolled hypertension can cause heart attack, stroke, kidney disease, and other problems. RISK FACTORS Some risk factors for high blood pressure are controllable. Others are not.  Risk factors you cannot control include:   Race. You may be at higher risk if you are African American.  Age. Risk increases with age.  Gender. Men are at higher risk than women before age 69 years. After age 66, women are at higher risk than men. Risk factors you can control include:  Not getting enough exercise or physical activity.  Being overweight.  Getting too much fat, sugar, calories, or salt in your diet.  Drinking too much alcohol. SIGNS AND SYMPTOMS Hypertension does not usually cause signs or symptoms. Extremely high blood pressure (hypertensive crisis) may cause headache, anxiety, shortness of breath, and nosebleed. DIAGNOSIS To check if you have hypertension, your health care provider will measure your blood pressure while you are seated, with your arm held at the level of  your heart. It should be measured at least twice using the same arm. Certain conditions can cause a difference in blood pressure between your right and left arms. A blood pressure reading that is higher than normal on one occasion does not mean that you need treatment. If it is not clear whether you have high blood pressure, you may be asked to return on a different day to have your blood pressure checked again. Or, you may be asked to monitor your blood pressure at home for 1 or more weeks. TREATMENT Treating high blood pressure includes making lifestyle changes and possibly taking medicine. Living a healthy lifestyle can help lower high blood pressure. You may need to change some of your habits. Lifestyle changes may include:  Following the DASH diet. This diet is high in fruits, vegetables, and whole grains. It is low in salt, red meat, and added sugars.  Keep your sodium intake below 2,300 mg per day.  Getting at least 30-45 minutes of aerobic exercise at least 4 times per week.  Losing weight if necessary.  Not smoking.  Limiting alcoholic beverages.  Learning ways to reduce stress. Your health care provider may prescribe medicine if lifestyle changes are not enough to get your blood pressure under control, and if one of the following is true:  You are 73-25 years of age and your systolic blood pressure is above 140.  You are 2 years of age or older, and your systolic blood pressure is above 150.  Your diastolic blood pressure is above 90.  You have diabetes, and your systolic blood pressure is over [id number] or your diastolic blood  pressure is over 90.  You have kidney disease and your blood pressure is above 140/90.  You have heart disease and your blood pressure is above 140/90. Your personal target blood pressure may vary depending on your medical conditions, your age, and other factors. HOME CARE INSTRUCTIONS  Have your blood pressure rechecked as directed by your health care  provider.   Take medicines only as directed by your health care provider. Follow the directions carefully. Blood pressure medicines must be taken as prescribed. The medicine does not work as well when you skip doses. Skipping doses also puts you at risk for problems.  Do not smoke.   Monitor your blood pressure at home as directed by your health care provider. SEEK MEDICAL CARE IF:   You think you are having a reaction to medicines taken.  You have recurrent headaches or feel dizzy.  You have swelling in your ankles.  You have trouble with your vision. SEEK IMMEDIATE MEDICAL CARE IF:  You develop a severe headache or confusion.  You have unusual weakness, numbness, or feel faint.  You have severe chest or abdominal pain.  You vomit repeatedly.  You have trouble breathing. MAKE SURE YOU:   Understand these instructions.  Will watch your condition.  Will get help right away if you are not doing well or get worse.   This information is not intended to replace advice given to you by your health care provider. Make sure you discuss any questions you have with your health care provider.   Document Released: 03/19/2005 Document Revised: 08/03/2014 Document Reviewed: 01/09/2013 Elsevier Interactive Patient Education 2016 [employer].    IF you received an x-ray today, you will receive an invoice from [facility]. Please contact [facility] at [phone] with questions or concerns regarding your invoice.   IF you received labwork today, you will receive an invoice from [employer]. Please contact [facility] at [phone] with questions or concerns regarding your invoice.   Our billing staff will not be able to assist you with questions regarding bills from these companies.  You will be contacted with the lab results as soon as they are available. The fastest way to get your results is to activate your My Chart account.  Instructions are located on the last page of this paperwork. If you have not heard from [place] regarding the results in 2 weeks, please contact this office.

## 2015-09-15 NOTE — Progress Notes (Signed)
MRN: [id number] DOB: [birth year]  Subjective:   Debra Mendoza is a 50 y.o. female presenting for chief complaint of Wheezing  Reports 3 month history of wheezing, shob, dry cough. Has tried Mucinex with some relief. Admits that she does not hydrate well at all, hardly drinks any fluids. Denies chest pain, fever, n/v, abdominal pain, sore throat, sinus pain, sinus congestion, lower leg swelling, hematuria. Denies smoking cigarettes but does have some secondary smoke. Denies history of asthma. Blood pressure checks are in the [id number] systolic and [id number] are at home. Denies family history of heart disease, cancer, lung disease, heart failure.  Debra Mendoza has a current medication list which includes the following prescription(s): vitamin c, biotin, calcium carbonate-vitamin d, lisinopril-hydrochlorothiazide, misc natural products, norethindrone, omega-3 fatty acids, and potassium chloride. Also has No Known Allergies.  Debra Mendoza  has a past medical history of Hypertension. Also  has past surgical history that includes Myomectomy and Novasure ablation.  Objective:   Vitals: BP 142/100 mmHg  Pulse 98  Temp(Src) 98.4 F (36.9 C) (Oral)  Resp 18  Ht 5' (1.524 m)  Wt 247 lb (112.038 kg)  BMI 48.24 kg/m2  SpO2 100%  Physical Exam  Constitutional: She is oriented to person, place, and time. She appears well-developed and well-nourished.  HENT:  Nasal turbinates pink and moist. Throat with significant post-nasal drainage.  Eyes: Right eye exhibits no discharge. Left eye exhibits no discharge. No scleral icterus.  Neck: Normal range of motion. Neck supple.  Cardiovascular: Normal rate, regular rhythm and intact distal pulses.  Exam reveals no gallop and no friction rub.   No murmur heard. Pulmonary/Chest: No respiratory distress. She has no wheezes. She has no rales.  Musculoskeletal: She exhibits no edema.  Lymphadenopathy:    She has no cervical adenopathy.  Neurological: She is alert and  oriented to person, place, and time.  Skin: Skin is warm and dry.   Results for orders placed or performed in visit on 09/15/15 (from the past 24 hour(s))  POCT CBC     Status: None   Collection Time: 09/15/15 10:58 AM  Result Value Ref Range   WBC 6.3 4.6 - 10.2 K/uL   Lymph, poc 2.5 0.6 - 3.4   POC LYMPH PERCENT 38.9 10 - 50 %L   MID (cbc) 0.4 0 - 0.9   POC MID % 6.8 0 - 12 %M   POC Granulocyte 3.4 2 - 6.9   Granulocyte percent 54.3 37 - 80 %G   RBC 5.37 4.04 - 5.48 M/uL   Hemoglobin 15.6 12.2 - 16.2 g/dL   HCT, POC 44.6 37.7 - 47.9 %   MCV 83.0 80 - 97 fL   MCH, POC 29.0 27 - 31.2 pg   MCHC 35.0 31.8 - 35.4 g/dL   RDW, POC 13.8 %   Platelet Count, POC 196 142 - 424 K/uL   MPV 9.3 0 - 99.8 fL   Dg Chest 2 View  09/15/2015  CLINICAL DATA:  Cough and wheezing. EXAM: CHEST  2 VIEW COMPARISON:  04/19/2012 FINDINGS: Cardiac silhouette is normal in size and configuration. No mediastinal or hilar masses or evidence of adenopathy. Lungs are clear.  No pleural effusion or pneumothorax. Bony thorax is intact. IMPRESSION: No active cardiopulmonary disease. Electronically Signed   By: Lajean Manes M.D.   On: 09/15/2015 11:05    Assessment and Plan :   1. Cough 2. Wheezing - May be due to lack of hydration, second hand smoke. X-ray and  cbc very reassuring, recheck if problem persists despite use of albuterol.  3. Essential hypertension - Patient is resistant to adding amlodipine. I printed a script for her with instructions in case she changes her mind. RTC in 4 weeks.   Jaynee Eagles, PA-C [facility] [phone] 09/15/2015 10:26 AM

## 2015-09-16 ENCOUNTER — Encounter: Payer: Self-pay | Admitting: Urgent Care

## 2015-09-16 LAB — COMPLETE METABOLIC PANEL WITH GFR
ALBUMIN: 4.5 g/dL (ref 3.6–5.1)
ALT: 33 U/L — ABNORMAL HIGH (ref 6–29)
AST: 27 U/L (ref 10–35)
Alkaline Phosphatase: 18 U/L — ABNORMAL LOW (ref 33–115)
BILIRUBIN TOTAL: 1.1 mg/dL (ref 0.2–1.2)
BUN: 13 mg/dL (ref 7–25)
CO2: 20 mmol/L (ref 20–31)
Calcium: 10.1 mg/dL (ref 8.6–10.2)
Chloride: 101 mmol/L (ref 98–110)
Creat: 0.89 mg/dL (ref 0.50–1.10)
GFR, EST NON AFRICAN AMERICAN: 76 mL/min (ref >=60)
GFR, Est African American: 88 mL/min (ref >=60)
GLUCOSE: 100 mg/dL — AB (ref 65–99)
Potassium: 4.2 mmol/L (ref 3.5–5.3)
SODIUM: 139 mmol/L (ref 135–146)
TOTAL PROTEIN: 7.8 g/dL (ref 6.1–8.1)

## 2015-11-07 ENCOUNTER — Ambulatory Visit (INDEPENDENT_AMBULATORY_CARE_PROVIDER_SITE_OTHER): Payer: BC Managed Care – PPO | Admitting: Physician Assistant

## 2015-11-07 VITALS — BP 132/80 | HR 114 | Temp 99.4°F | Resp 17 | Ht 60.0 in | Wt 254.0 lb

## 2015-11-07 DIAGNOSIS — I10 Essential (primary) hypertension: Secondary | ICD-10-CM | POA: Diagnosis not present

## 2015-11-07 LAB — COMPLETE METABOLIC PANEL WITH GFR
ALBUMIN: 4.3 g/dL (ref 3.6–5.1)
ALK PHOS: 18 U/L — AB (ref 33–115)
ALT: 32 U/L — AB (ref 6–29)
AST: 31 U/L (ref 10–35)
BILIRUBIN TOTAL: 1 mg/dL (ref 0.2–1.2)
BUN: 11 mg/dL (ref 7–25)
CO2: 24 mmol/L (ref 20–31)
CREATININE: 0.88 mg/dL (ref 0.50–1.10)
Calcium: 9.8 mg/dL (ref 8.6–10.2)
Chloride: 101 mmol/L (ref 98–110)
GFR, Est African American: 89 mL/min (ref >=60)
GFR, Est Non African American: 77 mL/min (ref >=60)
GLUCOSE: 102 mg/dL — AB (ref 65–99)
Potassium: 3.8 mmol/L (ref 3.5–5.3)
SODIUM: 137 mmol/L (ref 135–146)
TOTAL PROTEIN: 7.4 g/dL (ref 6.1–8.1)

## 2015-11-07 LAB — HM MAMMOGRAPHY: HM Mammogram: NORMAL (ref 0–4)

## 2015-11-07 LAB — TSH: TSH: 1.73 mIU/L

## 2015-11-07 NOTE — Patient Instructions (Addendum)
IF you received an x-ray today, you will receive an invoice from [facility]. Please contact [facility] at [phone] with questions or concerns regarding your invoice.   IF you received labwork today, you will receive an invoice from [employer]. Please contact [facility] at [phone] with questions or concerns regarding your invoice.   Our billing staff will not be able to assist you with questions regarding bills from these companies.  You will be contacted with the lab results as soon as they are available. The fastest way to get your results is to activate your My Chart account. Instructions are located on the last page of this paperwork. If you have not heard from [place] regarding the results in 2 weeks, please contact this office.     Exercising to Lose Weight Exercising can help you to lose weight. In order to lose weight through exercise, you need to do vigorous-intensity exercise. You can tell that you are exercising with vigorous intensity if you are breathing very hard and fast and cannot hold a conversation while exercising. Moderate-intensity exercise helps to maintain your current weight. You can tell that you are exercising at a moderate level if you have a higher heart rate and faster breathing, but you are still able to hold a conversation. HOW OFTEN SHOULD I EXERCISE? Choose an activity that you enjoy and set realistic goals. Your health care provider can help you to make an activity plan that works for you. Exercise regularly as directed by your health care provider. This may include:  Doing resistance training twice each week, such as:  Push-ups.  Sit-ups.  Lifting weights.  Using resistance bands.  Doing a given intensity of exercise for a given amount of time. Choose from these options:  150 minutes of moderate-intensity exercise every week.  75 minutes of vigorous-intensity exercise every week.  A mix of  moderate-intensity and vigorous-intensity exercise every week. Children, pregnant women, people who are out of shape, people who are overweight, and older adults may need to consult a health care provider for individual recommendations. If you have any sort of medical condition, be sure to consult your health care provider before starting a new exercise program. WHAT ARE SOME ACTIVITIES THAT CAN HELP ME TO LOSE WEIGHT?   Walking at a rate of at least 4.5 miles an hour.  Jogging or running at a rate of 5 miles per hour.  Biking at a rate of at least 10 miles per hour.  Lap swimming.  Roller-skating or in-line skating.  Cross-country skiing.  Vigorous competitive sports, such as football, basketball, and soccer.  Jumping rope.  Aerobic dancing. HOW CAN I BE MORE ACTIVE IN MY DAY-TO-DAY ACTIVITIES?  Use the stairs instead of the elevator.  Take a walk during your lunch break.  If you drive, park your car farther away from work or school.  If you take public transportation, get off one stop early and walk the rest of the way.  Make all of your phone calls while standing up and walking around.  Get up, stretch, and walk around every 30 minutes throughout the day. WHAT GUIDELINES SHOULD I FOLLOW WHILE EXERCISING?  Do not exercise so much that you hurt yourself, feel dizzy, or get very short of breath.  Consult your health care provider prior to starting a new exercise program.  Wear comfortable clothes and shoes with good support.  Drink plenty of water while you exercise to prevent dehydration or heat stroke.  Body water is lost during exercise and must be replaced.  Work out until you breathe faster and your heart beats faster.   This information is not intended to replace advice given to you by your health care provider. Make sure you discuss any questions you have with your health care provider.   Document Released: 04/21/2010 Document Revised: 04/09/2014 Document  Reviewed: 08/20/2013 Elsevier Interactive Patient Education 2016 [place] DASH stands for "Dietary Approaches to Stop Hypertension." The DASH eating plan is a healthy eating plan that has been shown to reduce high blood pressure (hypertension). Additional health benefits may include reducing the risk of type 2 diabetes mellitus, heart disease, and stroke. The DASH eating plan may also help with weight loss. WHAT DO I NEED TO KNOW ABOUT THE DASH EATING PLAN? For the DASH eating plan, you will follow these general guidelines:  Choose foods with a percent daily value for sodium of less than 5% (as listed on the food label).  Use salt-free seasonings or herbs instead of table salt or sea salt.  Check with your health care provider or pharmacist before using salt substitutes.  Eat lower-sodium products, often labeled as "lower sodium" or "no salt added."  Eat fresh foods.  Eat more vegetables, fruits, and low-fat dairy products.  Choose whole grains. Look for the word "whole" as the first word in the ingredient list.  Choose fish and skinless chicken or [place] more often than red meat. Limit fish, poultry, and meat to 6 oz (170 g) each day.  Limit sweets, desserts, sugars, and sugary drinks.  Choose heart-healthy fats.  Limit cheese to 1 oz (28 g) per day.  Eat more home-cooked food and less restaurant, buffet, and fast food.  Limit fried foods.  Cook foods using methods other than frying.  Limit canned vegetables. If you do use them, rinse them well to decrease the sodium.  When eating at a restaurant, ask that your food be prepared with less salt, or no salt if possible. WHAT FOODS CAN I EAT? Seek help from a dietitian for individual calorie needs. Grains Whole grain or whole wheat bread. Brown rice. Whole grain or whole wheat pasta. Quinoa, bulgur, and whole grain cereals. Low-sodium cereals. Corn or whole wheat flour tortillas. Whole grain cornbread.  Whole grain crackers. Low-sodium crackers. Vegetables Fresh or frozen vegetables (raw, steamed, roasted, or grilled). Low-sodium or reduced-sodium tomato and vegetable juices. Low-sodium or reduced-sodium tomato sauce and paste. Low-sodium or reduced-sodium canned vegetables.  Fruits All fresh, canned (in natural juice), or frozen fruits. Meat and Other Protein Products Ground beef (85% or leaner), grass-fed beef, or beef trimmed of fat. Skinless chicken or [place]. Ground chicken or [place]. Pork trimmed of fat. All fish and seafood. Eggs. Dried beans, peas, or lentils. Unsalted nuts and seeds. Unsalted canned beans. Dairy Low-fat dairy products, such as skim or 1% milk, 2% or reduced-fat cheeses, low-fat ricotta or cottage cheese, or plain low-fat yogurt. Low-sodium or reduced-sodium cheeses. Fats and Oils Tub margarines without trans fats. Light or reduced-fat mayonnaise and salad dressings (reduced sodium). Avocado. Safflower, olive, or canola oils. Natural peanut or almond butter. Other Unsalted popcorn and pretzels. The items listed above may not be a complete list of recommended foods or beverages. Contact your dietitian for more options. WHAT FOODS ARE NOT RECOMMENDED? Grains White bread. White pasta. White rice. Refined cornbread. Bagels and croissants. Crackers that contain trans fat. Vegetables Creamed or fried vegetables. Vegetables in a cheese sauce. Regular  canned vegetables. Regular canned tomato sauce and paste. Regular tomato and vegetable juices. Fruits Dried fruits. Canned fruit in light or heavy syrup. Fruit juice. Meat and Other Protein Products Fatty cuts of meat. Ribs, chicken wings, bacon, sausage, bologna, salami, chitterlings, fatback, hot dogs, bratwurst, and packaged luncheon meats. Salted nuts and seeds. Canned beans with salt. Dairy Whole or 2% milk, cream, half-and-half, and cream cheese. Whole-fat or sweetened yogurt. Full-fat cheeses or blue cheese. Nondairy  creamers and whipped toppings. Processed cheese, cheese spreads, or cheese curds. Condiments Onion and garlic salt, seasoned salt, table salt, and sea salt. Canned and packaged gravies. Worcestershire sauce. Tartar sauce. Barbecue sauce. Teriyaki sauce. Soy sauce, including reduced sodium. Steak sauce. Fish sauce. Oyster sauce. Cocktail sauce. Horseradish. Ketchup and mustard. Meat flavorings and tenderizers. Bouillon cubes. Hot sauce. Tabasco sauce. Marinades. Taco seasonings. Relishes. Fats and Oils Butter, stick margarine, lard, shortening, ghee, and bacon fat. Coconut, palm kernel, or palm oils. Regular salad dressings. Other Pickles and olives. Salted popcorn and pretzels. The items listed above may not be a complete list of foods and beverages to avoid. Contact your dietitian for more information. WHERE CAN I FIND MORE INFORMATION? National Heart, Lung, and Blood Institute: travelstabloid.com   This information is not intended to replace advice given to you by your health care provider. Make sure you discuss any questions you have with your health care provider.   Document Released: 03/08/2011 Document Revised: 04/09/2014 Document Reviewed: 01/21/2013 Elsevier Interactive Patient Education [employer].

## 2015-11-07 NOTE — Progress Notes (Signed)
Patient ID: Debra Mendoza, female   DOB: 03/10/1966, 50 y.o.   MRN: [id number] [facility] [address] [place] [address] [phone]  By signing my name below I, Debra Mendoza, attest that this documentation has been prepared under the direction and in the presence of [name] PA. Electonically Signed. Debra Mendoza, Scribe 11/07/2015 at 11:20 AM   Date:  11/07/2015   Name:  Kelleigh Prusak   DOB:  [birth year]   MRN:  [id number]  PCP:  Kennon Portela, MD    History of Present Illness:  Debra Mendoza is a 50 y.o. female patient who presents to [facility] for follow up and reevaluation for blood pressure. Pt denies any CP, seizures, dizziness, vision changes, fever, HAs, or lightheadedness. Pt reports SOB with exertion that started 7 months ago. Pt was taking lisinopril for HTN and was started on Norvasc as well. Pt states her BP has been ranging from 129-136/60-80.  Pt reports mild wheeze that she has had for a few months. Pt was prescribed albuterol inhaler at last visit, which pt did not fill. Pt denies smoking. Pt has friends who smoke around her. Pt denies asthma as a child.  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 Pt states the form was sent to her from [place] when she started taking HTN medication. Pt denies any episodes of vision changes, seizures, syncope or CP while driving   Never seen a cardiologist.   Patient Active Problem List   Diagnosis Date Noted   Numbness and tingling of left leg 11/04/2014   HTN (hypertension) 07/09/2011   Obesity 07/09/2011    Past Medical History:  Diagnosis Date   Hypertension     Past Surgical History:  Procedure Laterality Date   MYOMECTOMY     NOVASURE ABLATION      Social History  Substance Use Topics   Smoking status: Never Smoker   Smokeless tobacco: Never Used   Alcohol use No    Family History  Problem Relation Age of Onset    Hypertension Mother    Hypertension Father    Heart disease Father    Hypertension Maternal Grandmother    Colon cancer Neg Hx     No Known Allergies  Medication list has been reviewed and updated.  Current Outpatient Prescriptions on File Prior to Visit  Medication Sig Dispense Refill   amLODipine (NORVASC) 5 MG tablet Take 1 tablet (5 mg total) by mouth daily. 90 tablet 3   Ascorbic Acid (VITAMIN C) 100 MG tablet Take 1,000 mg by mouth daily.      Biotin 5000 MCG CAPS Take by mouth.     Calcium Carbonate-Vitamin D (CALCIUM 600+D PO) Take 1 tablet by mouth daily.     lisinopril-hydrochlorothiazide (PRINZIDE,ZESTORETIC) 20-25 MG per tablet Take 1 tablet by mouth daily. 90 tablet 3   Misc Natural Products (ESTROVEN ENERGY PO) Take by mouth.     norethindrone (AYGESTIN) 5 MG tablet Take 5 mg by mouth daily.     potassium chloride (K-DUR,KLOR-CON) 10 MEQ tablet Take 10 mEq by mouth 2 (two) times daily.     albuterol (PROVENTIL HFA;VENTOLIN HFA) 108 (90 Base) MCG/ACT inhaler Inhale 2 puffs into the lungs 2 (two) times daily as needed for wheezing or shortness of breath (cough, shortness of breath or wheezing.). (Patient not taking: Reported on 11/07/2015) 1 Inhaler 1   Omega-3 Fatty Acids (FISH OIL PO) Take 1  tablet by mouth daily.     No current facility-administered medications on file prior to visit.     ROS ROS unremarkable unless otherwise specified.  Physical Examination: BP 132/80 (BP Location: Right Arm, Patient Position: Sitting, Cuff Size: Large)    Pulse (!) 114    Temp 99.4 F (37.4 C) (Oral)    Resp 17    Ht 5' (1.524 m)    Wt 254 lb (115.2 kg)    SpO2 96%    BMI 49.61 kg/m  Ideal Body Weight: @FLOWAMB [id number]  Physical Exam  Constitutional: She is oriented to person, place, and time. She appears well-developed and well-nourished. No distress.  HENT:  Head: Normocephalic and atraumatic.  Right Ear: External ear normal.  Left Ear: External ear normal.   Eyes: Conjunctivae and EOM are normal. Pupils are equal, round, and reactive to light.  Cardiovascular: Regular rhythm, S1 normal, S2 normal and normal heart sounds.  Tachycardia present.  Exam reveals no gallop and no friction rub.   No murmur heard. Pulses:      Dorsalis pedis pulses are 2+ on the right side, and 2+ on the left side.       Posterior tibial pulses are 2+ on the right side, and 2+ on the left side.  Pulmonary/Chest: Effort normal. No respiratory distress.  Musculoskeletal:  Pt has no lower extremity edema, bilat.  Neurological: She is alert and oriented to person, place, and time.  Skin: She is not diaphoretic.  Psychiatric: She has a normal mood and affect. Her behavior is normal.    Wt Readings from Last 3 Encounters:  11/07/15 254 lb (115.2 kg)  09/15/15 247 lb (112 kg)  11/04/14 225 lb (102.1 kg)   EKG viewed and interpreted by [place]. EKG shows normal sinus rhythm with no acute changes.  HR 88   Assessment and Plan: Debra Mendoza is a 50 y.o. female who is here today for paperwork completion and follow up of htn. Significant weight gain appears to be main culprit of sob with heavy exertion.  Discussed diet, cutting out carbs such as her mac and cheese, and focus on vegetables and good proteins.  Given info on exercise for weight loss.  --will check tsh at this time, and follow up pending results.  --form completed.    Essential hypertension - Plan: COMPLETE METABOLIC PANEL WITH GFR, TSH, EKG 12-Lead   [name], PA-C [facility] 8/7/201711:23 AM

## 2016-01-08 ENCOUNTER — Other Ambulatory Visit: Payer: Self-pay | Admitting: Urgent Care

## 2016-01-08 DIAGNOSIS — I10 Essential (primary) hypertension: Secondary | ICD-10-CM

## 2016-01-28 ENCOUNTER — Ambulatory Visit (INDEPENDENT_AMBULATORY_CARE_PROVIDER_SITE_OTHER): Payer: BC Managed Care – PPO | Admitting: Urgent Care

## 2016-01-28 DIAGNOSIS — Z23 Encounter for immunization: Secondary | ICD-10-CM | POA: Diagnosis not present

## 2016-03-03 ENCOUNTER — Ambulatory Visit (INDEPENDENT_AMBULATORY_CARE_PROVIDER_SITE_OTHER): Payer: BC Managed Care – PPO | Admitting: Physician Assistant

## 2016-03-03 VITALS — BP 130/90 | HR 92 | Temp 98.7°F | Resp 16 | Ht 60.0 in | Wt 251.0 lb

## 2016-03-03 DIAGNOSIS — R0602 Shortness of breath: Secondary | ICD-10-CM | POA: Diagnosis not present

## 2016-03-03 LAB — CBC WITH DIFFERENTIAL/PLATELET
BASOS ABS: 0 {cells}/uL (ref 0–200)
Basophils Relative: 0 %
EOS ABS: 330 {cells}/uL (ref 15–500)
Eosinophils Relative: 5 %
HEMATOCRIT: 44.9 % (ref 35.0–45.0)
Hemoglobin: 15 g/dL (ref 11.7–15.5)
LYMPHS PCT: 34 %
Lymphs Abs: 2244 cells/uL (ref 850–3900)
MCH: 29 pg (ref 27.0–33.0)
MCHC: 33.4 g/dL (ref 32.0–36.0)
MCV: 86.7 fL (ref 80.0–100.0)
MONO ABS: 528 {cells}/uL (ref 200–950)
MONOS PCT: 8 %
MPV: 11.2 fL (ref 7.5–12.5)
NEUTROS PCT: 53 %
Neutro Abs: 3498 cells/uL (ref 1500–7800)
PLATELETS: 252 10*3/uL (ref 140–400)
RBC: 5.18 MIL/uL — ABNORMAL HIGH (ref 3.80–5.10)
RDW: 14.3 % (ref 11.0–15.0)
WBC: 6.6 10*3/uL (ref 3.8–10.8)

## 2016-03-03 MED ORDER — IPRATROPIUM BROMIDE 0.02 % IN SOLN
0.5000 mg | Freq: Once | RESPIRATORY_TRACT | Status: AC
  Administered 2016-03-03: 0.5 mg via RESPIRATORY_TRACT

## 2016-03-03 MED ORDER — ALBUTEROL SULFATE (2.5 MG/3ML) 0.083% IN NEBU
2.5000 mg | INHALATION_SOLUTION | Freq: Once | RESPIRATORY_TRACT | Status: AC
  Administered 2016-03-03: 2.5 mg via RESPIRATORY_TRACT

## 2016-03-03 MED ORDER — IPRATROPIUM-ALBUTEROL 20-100 MCG/ACT IN AERS: 1.0000 | INHALATION_SPRAY | Freq: Four times a day (QID) | RESPIRATORY_TRACT | 2 refills | Status: DC

## 2016-03-03 NOTE — Patient Instructions (Addendum)
Asthma, Acute Bronchospasm Acute bronchospasm caused by asthma is also referred to as an asthma attack. Bronchospasm means your air passages become narrowed. The narrowing is caused by inflammation and tightening of the muscles in the air tubes (bronchi) in your lungs. This can make it hard to breathe or cause you to wheeze and cough. What are the causes? Possible triggers are:  Animal dander from the skin, hair, or feathers of animals.  Dust mites contained in house dust.  Cockroaches.  Pollen from trees or grass.  Mold.  Cigarette or tobacco smoke.  Air pollutants such as dust, household cleaners, hair sprays, aerosol sprays, paint fumes, strong chemicals, or strong odors.  Cold air or weather changes. Cold air may trigger inflammation. Winds increase molds and pollens in the air.  Strong emotions such as crying or laughing hard.  Stress.  Certain medicines such as aspirin or beta-blockers.  Sulfites in foods and drinks, such as dried fruits and wine.  Infections or inflammatory conditions, such as a flu, cold, or inflammation of the nasal membranes (rhinitis).  Gastroesophageal reflux disease (GERD). GERD is a condition where stomach acid backs up into your esophagus.  Exercise or strenuous activity. What are the signs or symptoms?  Wheezing.  Excessive coughing, particularly at night.  Chest tightness.  Shortness of breath. How is this diagnosed? Your health care provider will ask you about your medical history and perform a physical exam. A chest X-ray or blood testing may be performed to look for other causes of your symptoms or other conditions that may have triggered your asthma attack. How is this treated? Treatment is aimed at reducing inflammation and opening up the airways in your lungs. Most asthma attacks are treated with inhaled medicines. These include quick relief or rescue medicines (such as bronchodilators) and controller medicines (such as inhaled  corticosteroids). These medicines are sometimes given through an inhaler or a nebulizer. Systemic steroid medicine taken by mouth or given through an IV tube also can be used to reduce the inflammation when an attack is moderate or severe. Antibiotic medicines are only used if a bacterial infection is present. Follow these instructions at home:  Rest.  Drink plenty of liquids. This helps the mucus to remain thin and be easily coughed up. Only use caffeine in moderation and do not use alcohol until you have recovered from your illness.  Do not smoke. Avoid being exposed to secondhand smoke.  You play a critical role in keeping yourself in good health. Avoid exposure to things that cause you to wheeze or to have breathing problems.  Keep your medicines up-to-date and available. Carefully follow your health care provider's treatment plan.  Take your medicine exactly as prescribed.  When pollen or pollution is bad, keep windows closed and use an air conditioner or go to places with air conditioning.  Asthma requires careful medical care. See your health care provider for a follow-up as advised. If you are more than [redacted] weeks pregnant and you were prescribed any new medicines, let your obstetrician know about the visit and how you are doing. Follow up with your health care provider as directed.  After you have recovered from your asthma attack, make an appointment with your outpatient doctor to talk about ways to reduce the likelihood of future attacks. If you do not have a doctor who manages your asthma, make an appointment with a primary care doctor to discuss your asthma. Get help right away if:  You are getting worse.  You have trouble breathing. If severe, call your local emergency services (911 in the [place]  You develop chest pain or discomfort.  You are vomiting.  You are not able to keep fluids down.  You are coughing up yellow, green, brown, or bloody sputum.  You have a fever  and your symptoms suddenly get worse.  You have trouble swallowing. This information is not intended to replace advice given to you by your health care provider. Make sure you discuss any questions you have with your health care provider. Document Released: 07/04/2006 Document Revised: 08/31/2015 Document Reviewed: 09/24/2012 Elsevier Interactive Patient Education  2017 [employer].    IF you received an x-ray today, you will receive an invoice from [facility]. Please contact [facility] at [phone] with questions or concerns regarding your invoice.   IF you received labwork today, you will receive an invoice from [employer]. Please contact [facility] at [phone] with questions or concerns regarding your invoice.   Our billing staff will not be able to assist you with questions regarding bills from these companies.  You will be contacted with the lab results as soon as they are available. The fastest way to get your results is to activate your My Chart account. Instructions are located on the last page of this paperwork. If you have not heard from [place] regarding the results in 2 weeks, please contact this office.

## 2016-03-03 NOTE — Progress Notes (Signed)
[facility] [address], [place] [address] [phone]  Date:  03/03/2016   Name:  Evania Pean   DOB:  02/07/1966   MRN:  [id number]  PCP:  Kennon Portela, MD    History of Present Illness:  Brianny Mansouri is a 50 y.o. female patient who presents to [facility] for sob.    She continues to have sob .  She has been using the albuterol for about 2 months.  It offers temporary relief, but does not last.  She denies any dizziness.  She has minimal coughing that is non-productive.  No fevers.  She has no leg swelling.  SOB is aggravated by ambulation, and activities.  Seated and calm helps.    --No nasal congestion, no sore throat, no fevers --BNP was normal 5 months ago --she is taking her anti-hypertensives.  --she is having hot flashes/sweating. --sob relieved with standing in the freezer at work, she states --no periods secondary to med (aygestin)  Wt Readings from Last 3 Encounters:  03/03/16 251 lb (113.9 kg)  11/07/15 254 lb (115.2 kg)  09/15/15 247 lb (112 kg)      Patient Active Problem List   Diagnosis Date Noted  . Numbness and tingling of left leg 11/04/2014  . HTN (hypertension) 07/09/2011  . Obesity 07/09/2011    Past Medical History:  Diagnosis Date  . Hypertension     Past Surgical History:  Procedure Laterality Date  . MYOMECTOMY    . [place] ABLATION      Social History  Substance Use Topics  . Smoking status: Never Smoker  . Smokeless tobacco: Never Used  . Alcohol use No    Family History  Problem Relation Age of Onset  . Hypertension Mother   . Hypertension Father   . Heart disease Father   . Hypertension Maternal Grandmother   . Colon cancer Neg Hx     No Known Allergies  Medication list has been reviewed and updated.  Current Outpatient Prescriptions on File Prior to Visit  Medication Sig Dispense Refill  . albuterol (PROVENTIL HFA;VENTOLIN HFA) 108 (90 Base) MCG/ACT inhaler Inhale 2 puffs  into the lungs 2 (two) times daily as needed for wheezing or shortness of breath (cough, shortness of breath or wheezing.). 1 Inhaler 1  . amLODipine (NORVASC) 5 MG tablet Take 1 tablet (5 mg total) by mouth daily. 90 tablet 3  . Ascorbic Acid (VITAMIN C) 100 MG tablet Take 1,000 mg by mouth daily.     . Biotin 5000 MCG CAPS Take by mouth.    . Calcium Carbonate-Vitamin D (CALCIUM 600+D PO) Take 1 tablet by mouth daily.    Marland Kitchen lisinopril-hydrochlorothiazide (PRINZIDE,ZESTORETIC) 20-25 MG tablet TAKE ONE TABLET BY MOUTH ONCE DAILY 90 tablet 1  . Misc Natural Products (ESTROVEN ENERGY PO) Take by mouth.    . norethindrone (AYGESTIN) 5 MG tablet Take 5 mg by mouth daily.    . Omega-3 Fatty Acids (FISH OIL PO) Take 1 tablet by mouth daily.    . potassium chloride (K-DUR,KLOR-CON) 10 MEQ tablet Take 10 mEq by mouth 2 (two) times daily.     No current facility-administered medications on file prior to visit.     ROS ROS otherwise unless listed above.  Physical Examination: BP 130/90 (BP Location: Right Arm, Patient Position: Sitting, Cuff Size: Large)   Pulse 92   Temp 98.7 F (37.1 C) (Oral)   Resp 16   Ht 5' (1.524 m)  Wt 251 lb (113.9 kg)   SpO2 92%   BMI 49.02 kg/m  Ideal Body Weight: Weight in (lb) to have BMI = 25: 127.7  Physical Exam  Constitutional: She is oriented to person, place, and time. She appears well-developed and well-nourished. No distress.  HENT:  Head: Normocephalic and atraumatic.  Right Ear: External ear normal.  Left Ear: External ear normal.  Eyes: Conjunctivae and EOM are normal. Pupils are equal, round, and reactive to light.  Cardiovascular: Normal rate.   Pulmonary/Chest: Effort normal. No apnea. No respiratory distress. She has wheezes (mild wheezing). She has no rhonchi.  Neurological: She is alert and oriented to person, place, and time.  Skin: She is not diaphoretic.  Psychiatric: She has a normal mood and affect. Her behavior is normal.   BP  130/90 (BP Location: Right Arm, Patient Position: Sitting, Cuff Size: Large)   Pulse 92   Temp 98.7 F (37.1 C) (Oral)   Resp 16   Ht 5' (1.524 m)   Wt 251 lb (113.9 kg)   SpO2 92%   PF 300 L/min Comment: POST- 320L/ml  BMI 49.02 kg/m   Assessment and Plan: Velena Mcveigh is a 50 y.o. female who is here today for sob.   Advised use of combivent at this time.  Will refer to pulmonology.  Advised precautions and symptoms to an immediate return. SOB (shortness of breath) - Plan: CBC with Differential/Platelet, EKG 12-Lead, PFT PULM FXN SPIROMETRY (94010), ipratropium (ATROVENT) nebulizer solution 0.5 mg, albuterol (PROVENTIL) (2.5 MG/3ML) 0.083% nebulizer solution 2.5 mg, Ambulatory referral to Pulmonology, Ipratropium-Albuterol (COMBIVENT) 20-100 MCG/ACT AERS respimat  Morbid obesity ([place]) - Plan: Ambulatory referral to Pulmonology, Ipratropium-Albuterol (COMBIVENT) 20-100 MCG/ACT AERS respimat  [name], PA-C [facility] 03/03/2016 8:50 AM

## 2016-03-08 ENCOUNTER — Encounter: Payer: Self-pay | Admitting: Internal Medicine

## 2016-03-08 ENCOUNTER — Ambulatory Visit (INDEPENDENT_AMBULATORY_CARE_PROVIDER_SITE_OTHER): Payer: BC Managed Care – PPO | Admitting: Internal Medicine

## 2016-03-08 VITALS — BP 116/88 | HR 100 | Ht 60.0 in | Wt 247.0 lb

## 2016-03-08 DIAGNOSIS — J45909 Unspecified asthma, uncomplicated: Secondary | ICD-10-CM | POA: Insufficient documentation

## 2016-03-08 DIAGNOSIS — I10 Essential (primary) hypertension: Secondary | ICD-10-CM | POA: Insufficient documentation

## 2016-03-08 MED ORDER — LOSARTAN POTASSIUM-HCTZ 50-12.5 MG PO TABS: 1.0000 | ORAL_TABLET | Freq: Every day | ORAL | 3 refills | Status: DC

## 2016-03-08 MED ORDER — MOMETASONE FURO-FORMOTEROL FUM 100-5 MCG/ACT IN AERO: 2.0000 | INHALATION_SPRAY | Freq: Two times a day (BID) | RESPIRATORY_TRACT | 11 refills | Status: DC

## 2016-03-08 NOTE — Progress Notes (Addendum)
Subjective:    Patient ID: Debra Mendoza, female    DOB: 1965/06/19, 50 y.o.   MRN: [id number] PCP [name], PA  HPI  IOV 03/08/2016  Chief Complaint  Patient presents with  . Pulmonary Consult    Pt referred by [name], PA, for SOB with activity x 5 months. Pt denies cough, swelling and CP/tightness.    50 year old obese female presents with a husband. For evaluation of dyspnea. She tells me that approximately 2 years ago she suffered pneumonia was hospitalized with oxygen therapy but was not on the ventilator and since then has had some amount of shortness of breath. Then approximately starting a year ago she gained 40 pounds of weight and is currently Body mass index is 48.24 kg/m. Marland Kitchen She says this was due to stress in her personal life. Then approximately starting June/July 2017 started noticing insidious onset of shortness of breath present on exertion relieved by rest. Moderate amount of exertion produces dyspnea. Then starting a few months ago started noticing random onset of wheezing. She had a chest x-ray June 2017 that I personally visualized and is clear. Hemoglobin on 03/03/2016 - 15 g percent and normal. Creatinine August 2017 is 0.88 mg percent and normal. Walking desaturation test 185 feet 3 laps on room air: Did not desaturate but got tachycardic. She tells me that approximately 5 days ago primary care physician started her on a Combivent MDI which she takes 4 times daily and this is significantly helped reduce her dyspnea and wheezing. This no orthopnea paroxysmal nocturnal dyspnea or hemoptysis.   She's noticed to be on lisinopril for many yerar sper preport  Exhaled nitric oxide today in the office 03/08/2016: 50ppb and elevated     has a past medical history of Hypertension.   reports that she has never smoked. She has never used smokeless tobacco.  Past Surgical History:  Procedure Laterality Date  . MYOMECTOMY    . NOVASURE ABLATION       No Known Allergies  Immunization History  Administered Date(s) Administered  . Influenza,inj,Quad PF,36+ Mos 01/03/2013, 12/26/2013, 01/27/2015, 01/28/2016  . Tdap 12/31/2011    Family History  Problem Relation Age of Onset  . Hypertension Mother   . Hypertension Father   . Heart disease Father   . Hypertension Maternal Grandmother   . Breast cancer Paternal Aunt   . Breast cancer Maternal Aunt   . Colon cancer Neg Hx      Current Outpatient Prescriptions:  .  albuterol (PROVENTIL HFA;VENTOLIN HFA) 108 (90 Base) MCG/ACT inhaler, Inhale 2 puffs into the lungs 2 (two) times daily as needed for wheezing or shortness of breath (cough, shortness of breath or wheezing.)., Disp: 1 Inhaler, Rfl: 1 .  amLODipine (NORVASC) 5 MG tablet, Take 1 tablet (5 mg total) by mouth daily., Disp: 90 tablet, Rfl: 3 .  Ascorbic Acid (VITAMIN C) 100 MG tablet, Take 1,000 mg by mouth daily. , Disp: , Rfl:  .  Biotin 5000 MCG CAPS, Take by mouth., Disp: , Rfl:  .  Black Cohosh 40 MG CAPS, Take 40 mg by mouth., Disp: , Rfl:  .  Calcium Carbonate-Vitamin D (CALCIUM 600+D PO), Take 1 tablet by mouth daily., Disp: , Rfl:  .  Ipratropium-Albuterol (COMBIVENT) 20-100 MCG/ACT AERS respimat, Inhale 1 puff into the lungs every 6 (six) hours., Disp: 4 g, Rfl: 2 .  lisinopril-hydrochlorothiazide (PRINZIDE,ZESTORETIC) 20-25 MG tablet, TAKE ONE TABLET BY MOUTH ONCE DAILY, Disp: 90 tablet, Rfl: 1 .  norethindrone (AYGESTIN) 5 MG tablet, Take 5 mg by mouth daily., Disp: , Rfl:  .  potassium chloride (K-DUR,KLOR-CON) 10 MEQ tablet, Take 10 mEq by mouth 2 (two) times daily., Disp: , Rfl:      Review of Systems  Constitutional: Negative for fever and unexpected weight change.  HENT: Negative for congestion, dental problem, ear pain, nosebleeds, postnasal drip, rhinorrhea, sinus pressure, sneezing, sore throat and trouble swallowing.   Eyes: Negative for redness and itching.  Respiratory: Positive for shortness of  breath. Negative for cough, chest tightness and wheezing.   Cardiovascular: Negative for palpitations and leg swelling.  Gastrointestinal: Negative for nausea and vomiting.  Genitourinary: Negative for dysuria.  Musculoskeletal: Negative for joint swelling.  Skin: Negative for rash.  Neurological: Negative for headaches.  Hematological: Does not bruise/bleed easily.  Psychiatric/Behavioral: Negative for dysphoric mood. The patient is not nervous/anxious.        Objective:   Physical Exam  Constitutional: She is oriented to person, place, and time. She appears well-developed and well-nourished. No distress.  Morbidly obese  HENT:  Head: Normocephalic and atraumatic.  Right Ear: External ear normal.  Left Ear: External ear normal.  Mouth/Throat: Oropharynx is clear and moist. No oropharyngeal exudate.  Eyes: Conjunctivae and EOM are normal. Pupils are equal, round, and reactive to light. Right eye exhibits no discharge. Left eye exhibits no discharge. No scleral icterus.  Neck: Normal range of motion. Neck supple. No JVD present. No tracheal deviation present. No thyromegaly present.  Cardiovascular: Normal rate, regular rhythm, normal heart sounds and intact distal pulses.  Exam reveals no gallop and no friction rub.   No murmur heard. Pulmonary/Chest: Effort normal and breath sounds normal. No respiratory distress. She has no wheezes. She has no rales. She exhibits no tenderness.  Abdominal: Soft. Bowel sounds are normal. She exhibits no distension and no mass. There is no tenderness. There is no rebound and no guarding.  Musculoskeletal: Normal range of motion. She exhibits no edema or tenderness.  Lymphadenopathy:    She has no cervical adenopathy.  Neurological: She is alert and oriented to person, place, and time. She has normal reflexes. No cranial nerve deficit. She exhibits normal muscle tone. Coordination normal.  Skin: Skin is warm and dry. No rash noted. She is not  diaphoretic. No erythema. No pallor.  Psychiatric: She has a normal mood and affect. Her behavior is normal. Judgment and thought content normal.  Vitals reviewed.  50 Vitals:   03/08/16 0927  BP: 116/88  Pulse: 100  SpO2: 97%  Weight: 247 lb (112 kg)  Height: 5' (1.524 m)   Estimated body mass index is 48.24 kg/m as calculated from the following:   Height as of this encounter: 5' (1.524 m).   Weight as of this encounter: 247 lb (112 kg).       Assessment & Plan:     ICD-9-CM ICD-10-CM   1. Moderate asthma, unspecified whether complicated, unspecified whether persistent 493.90 J45.909   2. Essential hypertension, benign 401.1 I10    you have asthma  Plan  - stop lisinopril  - start losartan-hctz 50/12.5  - use combivent only as needed -> after this runs out change to albuterol as needed - start dulera 100, 2 puff twice daily for asthma maintenance  - scheduled daily inhaler  - rinse mouth after use  followup 6 weeks with APP or myself - FeNo at followup   Dr. Brand Males, M.D., [facility].C.P Pulmonary and Critical Care Medicine Staff Physician  [facility] Pager: [phone], If no answer or between  15:00h - 7:00h: call [phone]  03/08/2016 10:06 AM

## 2016-03-08 NOTE — Addendum Note (Signed)
Addended by: Collier Salina on: 03/08/2016 10:16 AM   Modules accepted: Orders

## 2016-03-08 NOTE — Patient Instructions (Addendum)
ICD-9-CM ICD-10-CM   1. Moderate asthma, unspecified whether complicated, unspecified whether persistent 493.90 J45.909   2. Essential hypertension, benign 401.1 I10   you have asthma  Plan   you have asthma  Plan  - stop lisinopril  - start losartan-hctz 50/12.5  - use combivent only as needed -> after this runs out change to albuterol as needed - start dulera 100, 2 puff twice daily for asthma maintenance  - scheduled daily inhaler  - rinse mouth after use  followup 6 weeks with APP or myself - FeNo at followup   NOTE: insurance might deny FeNO test as "Experimental". However, it is NOT ans was really helpful in making the diagnosis. If you get such a denial and you cannot pay for it yourself please let us know immediately

## 2016-04-19 ENCOUNTER — Ambulatory Visit: Payer: BC Managed Care – PPO | Admitting: Acute Care

## 2016-04-27 LAB — NITRIC OXIDE: Nitric Oxide: 50

## 2016-04-27 NOTE — Addendum Note (Signed)
Addended by: Collier Salina on: 04/27/2016 11:49 AM   Modules accepted: Orders

## 2016-05-03 ENCOUNTER — Ambulatory Visit (INDEPENDENT_AMBULATORY_CARE_PROVIDER_SITE_OTHER): Payer: BC Managed Care – PPO | Admitting: Physician Assistant

## 2016-05-03 VITALS — BP 132/100 | HR 83 | Temp 99.1°F | Resp 16 | Ht 60.0 in | Wt 246.4 lb

## 2016-05-03 DIAGNOSIS — B9789 Other viral agents as the cause of diseases classified elsewhere: Secondary | ICD-10-CM | POA: Diagnosis not present

## 2016-05-03 DIAGNOSIS — J0191 Acute recurrent sinusitis, unspecified: Secondary | ICD-10-CM

## 2016-05-03 DIAGNOSIS — J988 Other specified respiratory disorders: Secondary | ICD-10-CM | POA: Diagnosis not present

## 2016-05-03 NOTE — Progress Notes (Signed)
[facility] [address], [place] [address] [phone]  Date:  05/03/2016   Name:  Debra Mendoza   DOB:  1965/07/22   MRN:  [id number]  PCP:  [name], PA    History of Present Illness:  Debra Mendoza is a 51 y.o. female patient who presents to [facility] for cc of congestion and chest tightness. Patient reports cough that is mostly non-productive and clear.  There is congestion that is intermittently green at the nasal passages.  Mild sinus pressure.  No fever.  No sob or dypsnea.  She is compliant with taking her inhaler preventatively.  She has used the albuterol once.  She has used the mucinex and flonase which has helped.  Also to note..Called patient. Advised patient of provider's approval for requested procedure, as well as any comments/instructions from provider.   Provided patient w/ verbal instructions concerning pre-, intra- and post-procedure preparation and instructions.  She was placed on losartan-hctz by pulmonologist.  Patient has not taken it in weeks.  She states that she has lisinopril left of a 90 day supply.  Patient notes that she has had some coughing that in the last few weeks.     Patient Active Problem List   Diagnosis Date Noted  . Moderate asthma 03/08/2016  . Essential hypertension, benign 03/08/2016  . Numbness and tingling of left leg 11/04/2014  . HTN (hypertension) 07/09/2011  . Obesity 07/09/2011    Past Medical History:  Diagnosis Date  . Hypertension     Past Surgical History:  Procedure Laterality Date  . MYOMECTOMY    . [place] ABLATION      Social History  Substance Use Topics  . Smoking status: Never Smoker  . Smokeless tobacco: Never Used  . Alcohol use Yes     Comment: occassional     Family History  Problem Relation Age of Onset  . Hypertension Mother   . Hypertension Father   . Heart disease Father   . Hypertension Maternal Grandmother   . Breast cancer Paternal Aunt   . Breast  cancer Maternal Aunt   . Colon cancer Neg Hx     No Known Allergies  Medication list has been reviewed and updated.  Current Outpatient Prescriptions on File Prior to Visit  Medication Sig Dispense Refill  . albuterol (PROVENTIL HFA;VENTOLIN HFA) 108 (90 Base) MCG/ACT inhaler Inhale 2 puffs into the lungs 2 (two) times daily as needed for wheezing or shortness of breath (cough, shortness of breath or wheezing.). 1 Inhaler 1  . amLODipine (NORVASC) 5 MG tablet Take 1 tablet (5 mg total) by mouth daily. 90 tablet 3  . Ascorbic Acid (VITAMIN C) 100 MG tablet Take 1,000 mg by mouth daily.     . Biotin 5000 MCG CAPS Take by mouth.    . Black Cohosh 40 MG CAPS Take 40 mg by mouth.    . Calcium Carbonate-Vitamin D (CALCIUM 600+D PO) Take 1 tablet by mouth daily.    . Ipratropium-Albuterol (COMBIVENT) 20-100 MCG/ACT AERS respimat Inhale 1 puff into the lungs every 6 (six) hours. 4 g 2  . losartan-hydrochlorothiazide (HYZAAR) 50-12.5 MG tablet Take 1 tablet by mouth daily. 30 tablet 3  . mometasone-formoterol (DULERA) 100-5 MCG/ACT AERO Inhale 2 puffs into the lungs 2 (two) times daily. 1 Inhaler 11  . norethindrone (AYGESTIN) 5 MG tablet Take 5 mg by mouth daily.    . potassium chloride (K-DUR,KLOR-CON) 10 MEQ tablet Take 10 mEq by  mouth 2 (two) times daily.     No current facility-administered medications on file prior to visit.     ROS ROS otherwise unremarkable unless listed above.   Physical Examination: BP (!) 132/100   Pulse 83   Temp 99.1 F (37.3 C) (Oral)   Resp 16   Ht 5' (1.524 m) Comment: with shoes  Wt 246 lb 6.4 oz (111.8 kg)   SpO2 99%   BMI 48.12 kg/m  Ideal Body Weight: Weight in (lb) to have BMI = 25: 127.7  Physical Exam  Constitutional: She is oriented to person, place, and time. She appears well-developed and well-nourished. No distress.  HENT:  Head: Normocephalic and atraumatic.  Right Ear: Tympanic membrane, external ear and ear canal normal.  Left Ear:  Tympanic membrane, external ear and ear canal normal.  Nose: Mucosal edema and rhinorrhea present. Right sinus exhibits no maxillary sinus tenderness and no frontal sinus tenderness. Left sinus exhibits no maxillary sinus tenderness and no frontal sinus tenderness.  Mouth/Throat: No uvula swelling. No oropharyngeal exudate, posterior oropharyngeal edema or posterior oropharyngeal erythema.  Eyes: Conjunctivae and EOM are normal. Pupils are equal, round, and reactive to light.  Cardiovascular: Normal rate and regular rhythm.  Exam reveals no gallop, no distant heart sounds and no friction rub.   No murmur heard. Pulmonary/Chest: Effort normal. No respiratory distress. She has no decreased breath sounds. She has no wheezes. She has no rhonchi.  Lymphadenopathy:       Head (right side): No submandibular, no tonsillar, no preauricular and no posterior auricular adenopathy present.       Head (left side): No submandibular, no tonsillar, no preauricular and no posterior auricular adenopathy present.  Neurological: She is alert and oriented to person, place, and time.  Skin: She is not diaphoretic.  Psychiatric: She has a normal mood and affect. Her behavior is normal.     Assessment and Plan: Debra Mendoza is a 51 y.o. female who is here today for sinus congestion and chest tightness.  Advised heavy hydration. Advised her to restart the losartan-hctz.  Lisinopril may be increasing the inflammation with respiratory system.  Viral respiratory illness  Acute recurrent sinusitis, unspecified location  [name], PA-C [facility] 2/5/20181:36 PM

## 2016-05-03 NOTE — Patient Instructions (Addendum)
Please start the losartan-hctz and discontinue the lisinopril. I would like you to make sure that you are hydrating well with water.  Continue mucinex and flonase.    Viral Respiratory Infection Introduction A viral respiratory infection is an illness that affects parts of the body used for breathing, like the lungs, nose, and throat. It is caused by a germ called a virus. Some examples of this kind of infection are:  A cold.  The flu (influenza).  A respiratory syncytial virus (RSV) infection. How do I know if I have this infection? Most of the time this infection causes:  A stuffy or runny nose.  Yellow or green fluid in the nose.  A cough.  Sneezing.  Tiredness (fatigue).  Achy muscles.  A sore throat.  Sweating or chills.  A fever.  A headache. How is this infection treated? If the flu is diagnosed early, it may be treated with an antiviral medicine. This medicine shortens the length of time a person has symptoms. Symptoms may be treated with over-the-counter and prescription medicines, such as:  Expectorants. These make it easier to cough up mucus.  Decongestant nasal sprays. Doctors do not prescribe antibiotic medicines for viral infections. They do not work with this kind of infection. How do I know if I should stay home? To keep others from getting sick, stay home if you have:  A fever.  A lasting cough.  A sore throat.  A runny nose.  Sneezing.  Muscles aches.  Headaches.  Tiredness.  Weakness.  Chills.  Sweating.  An upset stomach (nausea). Follow these instructions at home:  Rest as much as possible.  Take over-the-counter and prescription medicines only as told by your doctor.  Drink enough fluid to keep your pee (urine) clear or pale yellow.  Gargle with salt water. Do this 3-4 times per day or as needed. To make a salt-water mixture, dissolve -1 tsp of salt in 1 cup of warm water. Make sure the salt dissolves all the  way.  Use nose drops made from salt water. This helps with stuffiness (congestion). It also helps soften the skin around your nose.  Do not drink alcohol.  Do not use tobacco products, including cigarettes, chewing tobacco, and e-cigarettes. If you need help quitting, ask your doctor. Get help if:  Your symptoms last for 10 days or longer.  Your symptoms get worse over time.  You have a fever.  You have very bad pain in your face or forehead.  Parts of your jaw or neck become very swollen. Get help right away if:  You feel pain or pressure in your chest.  You have shortness of breath.  You faint or feel like you will faint.  You keep throwing up (vomiting).  You feel confused. This information is not intended to replace advice given to you by your health care provider. Make sure you discuss any questions you have with your health care provider. Document Released: 03/01/2008 Document Revised: 08/25/2015 Document Reviewed: 08/25/2014  2017 Elsevier    IF you received an x-ray today, you will receive an invoice from [facility]. Please contact [facility] at [phone] with questions or concerns regarding your invoice.   IF you received labwork today, you will receive an invoice from [facility]. Please contact [facility] at [phone] with questions or concerns regarding your invoice.   Our billing staff will not be able to assist you with questions regarding bills from these companies.  You will be contacted with the lab  results as soon as they are available. The fastest way to get your results is to activate your My Chart account. Instructions are located on the last page of this paperwork. If you have not heard from [place] regarding the results in 2 weeks, please contact this office.

## 2016-05-29 ENCOUNTER — Telehealth: Payer: Self-pay | Admitting: Internal Medicine

## 2016-05-29 NOTE — Telephone Encounter (Signed)
Called to initiate the PA for the Dulera 100.  PA is not able to be done on this due to insurance requiring that pt try and fail all of the following:  advair breo and symbicort.    MR please advise. Thanks   ID# [id number]

## 2016-05-30 NOTE — Telephone Encounter (Signed)
Ok to change to symbicort 80/4.5, 2 puff bid  Thanks  Dr. Brand Males, M.D., [facility].C.P Pulmonary and Critical Care Medicine Staff Physician [place] [facility] Pager: [phone], If no answer or between  15:00h - 7:00h: call [phone]  05/30/2016 3:16 PM

## 2016-05-30 NOTE — Telephone Encounter (Signed)
LMTCB for patient.

## 2016-05-31 MED ORDER — BUDESONIDE-FORMOTEROL FUMARATE 80-4.5 MCG/ACT IN AERO: 2.0000 | INHALATION_SPRAY | Freq: Two times a day (BID) | RESPIRATORY_TRACT | 5 refills | Status: DC

## 2016-05-31 NOTE — Telephone Encounter (Signed)
Patient returning phone call 

## 2016-05-31 NOTE — Telephone Encounter (Signed)
Spoke with pt. She is aware of the medication change. Rx has been sent in. Nothing further was needed. 

## 2016-06-05 ENCOUNTER — Telehealth: Payer: Self-pay | Admitting: Acute Care

## 2016-06-05 NOTE — Telephone Encounter (Signed)
Spoke with the pt and notified of recs per MR  She verbalized understanding and nothing further needed

## 2016-06-05 NOTE — Telephone Encounter (Signed)
Per last OV with MR, pt was given rx for Dulera 100.  Dulera 100 was not covered by insurance and patient was switched to Symbicort [place], MD  3:16 PM  Note  Ok to change to symbicort 80/4.5, 2 puff bid  Pt states that she feels that the inhaler is too strong it is causing her to be more congested in her chest. Pt wanting to know if she can back down off the frequency and only use it 1 puff BID to see if she feels better.  Please advise Dr Chase Caller. Thanks.

## 2016-06-05 NOTE — Telephone Encounter (Signed)
Ok to try symbicort 80/4.5 at 1 puff bid. IF disease getting worse, she is to call back  Dr. Brand Males, M.D., [facility].C.P Pulmonary and Critical Care Medicine Staff Physician [place] [facility] Pager: [phone], If no answer or between  15:00h - 7:00h: call [phone]  06/05/2016 12:58 PM

## 2016-08-09 ENCOUNTER — Ambulatory Visit (INDEPENDENT_AMBULATORY_CARE_PROVIDER_SITE_OTHER): Payer: BC Managed Care – PPO | Admitting: Physician Assistant

## 2016-08-09 ENCOUNTER — Encounter: Payer: Self-pay | Admitting: Physician Assistant

## 2016-08-09 VITALS — BP 156/93 | HR 82 | Temp 98.8°F | Resp 18 | Ht 60.0 in | Wt 249.4 lb

## 2016-08-09 DIAGNOSIS — J45909 Unspecified asthma, uncomplicated: Secondary | ICD-10-CM | POA: Diagnosis not present

## 2016-08-09 DIAGNOSIS — J302 Other seasonal allergic rhinitis: Secondary | ICD-10-CM

## 2016-08-09 DIAGNOSIS — I10 Essential (primary) hypertension: Secondary | ICD-10-CM

## 2016-08-09 LAB — CMP14+EGFR
ALT: 28 IU/L (ref 0–32)
AST: 28 IU/L (ref 0–40)
Albumin/Globulin Ratio: 1.6 (ref 1.2–2.2)
Albumin: 4.7 g/dL (ref 3.5–5.5)
Alkaline Phosphatase: 26 IU/L — ABNORMAL LOW (ref 39–117)
BILIRUBIN TOTAL: 0.5 mg/dL (ref 0.0–1.2)
BUN/Creatinine Ratio: 11 (ref 9–23)
BUN: 11 mg/dL (ref 6–24)
CALCIUM: 10 mg/dL (ref 8.7–10.2)
CHLORIDE: 97 mmol/L (ref 96–106)
CO2: 26 mmol/L (ref 18–29)
CREATININE: 1.04 mg/dL — AB (ref 0.57–1.00)
GFR, EST AFRICAN AMERICAN: 73 mL/min/{1.73_m2} (ref >59)
GFR, EST NON AFRICAN AMERICAN: 63 mL/min/{1.73_m2} (ref >59)
GLUCOSE: 98 mg/dL (ref 65–99)
Globulin, Total: 2.9 g/dL (ref 1.5–4.5)
Potassium: 4.8 mmol/L (ref 3.5–5.2)
Sodium: 138 mmol/L (ref 134–144)
TOTAL PROTEIN: 7.6 g/dL (ref 6.0–8.5)

## 2016-08-09 MED ORDER — CETIRIZINE HCL 10 MG PO TABS
10.0000 mg | ORAL_TABLET | Freq: Every day | ORAL | 5 refills | Status: DC
Start: 2016-08-09 — End: 2017-12-09

## 2016-08-09 MED ORDER — BUDESONIDE-FORMOTEROL FUMARATE 80-4.5 MCG/ACT IN AERO
2.0000 | INHALATION_SPRAY | Freq: Two times a day (BID) | RESPIRATORY_TRACT | 5 refills | Status: DC
Start: 2016-08-09 — End: 2017-07-30

## 2016-08-09 MED ORDER — AMLODIPINE BESYLATE 5 MG PO TABS: 5.0000 mg | ORAL_TABLET | Freq: Every day | ORAL | 1 refills | Status: DC

## 2016-08-09 MED ORDER — LOSARTAN POTASSIUM-HCTZ 50-12.5 MG PO TABS: 1.0000 | ORAL_TABLET | Freq: Every day | ORAL | 5 refills | Status: DC

## 2016-08-09 NOTE — Patient Instructions (Addendum)
Please continue to hydrate  Start the zyrtec. I would like you to increase to 2 puffs twice per day  DASH Eating Plan DASH stands for "Dietary Approaches to Stop Hypertension." The DASH eating plan is a healthy eating plan that has been shown to reduce high blood pressure (hypertension). It may also reduce your risk for type 2 diabetes, heart disease, and stroke. The DASH eating plan may also help with weight loss. What are tips for following this plan? General guidelines   Avoid eating more than 2,300 mg (milligrams) of salt (sodium) a day. If you have hypertension, you may need to reduce your sodium intake to 1,500 mg a day.  Limit alcohol intake to no more than 1 drink a day for nonpregnant women and 2 drinks a day for men. One drink equals 12 oz of beer, 5 oz of wine, or 1 oz of hard liquor.  Work with your health care provider to maintain a healthy body weight or to lose weight. Ask what an ideal weight is for you.  Get at least 30 minutes of exercise that causes your heart to beat faster (aerobic exercise) most days of the week. Activities may include walking, swimming, or biking.  Work with your health care provider or diet and nutrition specialist (dietitian) to adjust your eating plan to your individual calorie needs. Reading food labels   Check food labels for the amount of sodium per serving. Choose foods with less than 5 percent of the Daily Value of sodium. Generally, foods with less than 300 mg of sodium per serving fit into this eating plan.  To find whole grains, look for the word "whole" as the first word in the ingredient list. Shopping   Buy products labeled as "low-sodium" or "no salt added."  Buy fresh foods. Avoid canned foods and premade or frozen meals. Cooking   Avoid adding salt when cooking. Use salt-free seasonings or herbs instead of table salt or sea salt. Check with your health care provider or pharmacist before using salt substitutes.  Do not fry  foods. Cook foods using healthy methods such as baking, boiling, grilling, and broiling instead.  Cook with heart-healthy oils, such as olive, canola, soybean, or sunflower oil. Meal planning    Eat a balanced diet that includes:  5 or more servings of fruits and vegetables each day. At each meal, try to fill half of your plate with fruits and vegetables.  Up to 6-8 servings of whole grains each day.  Less than 6 oz of lean meat, poultry, or fish each day. A 3-oz serving of meat is about the same size as a deck of cards. One egg equals 1 oz.  2 servings of low-fat dairy each day.  A serving of nuts, seeds, or beans 5 times each week.  Heart-healthy fats. Healthy fats called Omega-3 fatty acids are found in foods such as flaxseeds and coldwater fish, like sardines, salmon, and mackerel.  Limit how much you eat of the following:  Canned or prepackaged foods.  Food that is high in trans fat, such as fried foods.  Food that is high in saturated fat, such as fatty meat.  Sweets, desserts, sugary drinks, and other foods with added sugar.  Full-fat dairy products.  Do not salt foods before eating.  Try to eat at least 2 vegetarian meals each week.  Eat more home-cooked food and less restaurant, buffet, and fast food.  When eating at a restaurant, ask that your food be prepared with  less salt or no salt, if possible. What foods are recommended? The items listed may not be a complete list. Talk with your dietitian about what dietary choices are best for you. Grains  Whole-grain or whole-wheat bread. Whole-grain or whole-wheat pasta. Brown rice. [place]. Bulgur. Whole-grain and low-sodium cereals. Pita bread. Low-fat, low-sodium crackers. Whole-wheat flour tortillas. Vegetables  Fresh or frozen vegetables (raw, steamed, roasted, or grilled). Low-sodium or reduced-sodium tomato and vegetable juice. Low-sodium or reduced-sodium tomato sauce and tomato paste. Low-sodium or  reduced-sodium canned vegetables. Fruits  All fresh, dried, or frozen fruit. Canned fruit in natural juice (without added sugar). Meat and other protein foods  Skinless chicken or [place]. Ground chicken or [place]. Pork with fat trimmed off. Fish and seafood. Egg whites. Dried beans, peas, or lentils. Unsalted nuts, nut butters, and seeds. Unsalted canned beans. Lean cuts of beef with fat trimmed off. Low-sodium, lean deli meat. Dairy  Low-fat (1%) or fat-free (skim) milk. Fat-free, low-fat, or reduced-fat cheeses. Nonfat, low-sodium ricotta or cottage cheese. Low-fat or nonfat yogurt. Low-fat, low-sodium cheese. Fats and oils  Soft margarine without trans fats. Vegetable oil. Low-fat, reduced-fat, or light mayonnaise and salad dressings (reduced-sodium). Canola, safflower, olive, soybean, and sunflower oils. Avocado. Seasoning and other foods  Herbs. Spices. Seasoning mixes without salt. Unsalted popcorn and pretzels. Fat-free sweets. What foods are not recommended? The items listed may not be a complete list. Talk with your dietitian about what dietary choices are best for you. Grains  Baked goods made with fat, such as croissants, muffins, or some breads. Dry pasta or rice meal packs. Vegetables  Creamed or fried vegetables. Vegetables in a cheese sauce. Regular canned vegetables (not low-sodium or reduced-sodium). Regular canned tomato sauce and paste (not low-sodium or reduced-sodium). Regular tomato and vegetable juice (not low-sodium or reduced-sodium). Angie Fava. Olives. Fruits  Canned fruit in a light or heavy syrup. Fried fruit. Fruit in cream or butter sauce. Meat and other protein foods  Fatty cuts of meat. Ribs. Fried meat. Berniece Salines. Sausage. Bologna and other processed lunch meats. Salami. Fatback. Hotdogs. Bratwurst. Salted nuts and seeds. Canned beans with added salt. Canned or smoked fish. Whole eggs or egg yolks. Chicken or [place] with skin. Dairy  Whole or 2% milk, cream, and  half-and-half. Whole or full-fat cream cheese. Whole-fat or sweetened yogurt. Full-fat cheese. Nondairy creamers. Whipped toppings. Processed cheese and cheese spreads. Fats and oils  Butter. Stick margarine. Lard. Shortening. Ghee. Bacon fat. Tropical oils, such as coconut, palm kernel, or palm oil. Seasoning and other foods  Salted popcorn and pretzels. Onion salt, garlic salt, seasoned salt, table salt, and sea salt. Worcestershire sauce. Tartar sauce. Barbecue sauce. Teriyaki sauce. Soy sauce, including reduced-sodium. Steak sauce. Canned and packaged gravies. Fish sauce. Oyster sauce. Cocktail sauce. Horseradish that you find on the shelf. Ketchup. Mustard. Meat flavorings and tenderizers. Bouillon cubes. Hot sauce and Tabasco sauce. Premade or packaged marinades. Premade or packaged taco seasonings. Relishes. Regular salad dressings. Where to find more information:  National Heart, Lung, and [place]: https://wilson-eaton.com/  American Heart Association: www.heart.org Summary  The DASH eating plan is a healthy eating plan that has been shown to reduce high blood pressure (hypertension). It may also reduce your risk for type 2 diabetes, heart disease, and stroke.  With the DASH eating plan, you should limit salt (sodium) intake to 2,300 mg a day. If you have hypertension, you may need to reduce your sodium intake to 1,500 mg a day.  When on the DASH  eating plan, aim to eat more fresh fruits and vegetables, whole grains, lean proteins, low-fat dairy, and heart-healthy fats.  Work with your health care provider or diet and nutrition specialist (dietitian) to adjust your eating plan to your individual calorie needs. This information is not intended to replace advice given to you by your health care provider. Make sure you discuss any questions you have with your health care provider. Document Released: 03/08/2011 Document Revised: 03/12/2016 Document Reviewed: 03/12/2016 Elsevier Interactive  Patient Education  2017 [employer].    IF you received an x-ray today, you will receive an invoice from [facility]. Please contact [facility] at [phone] with questions or concerns regarding your invoice.   IF you received labwork today, you will receive an invoice from [facility]. Please contact [facility] at [phone] with questions or concerns regarding your invoice.   Our billing staff will not be able to assist you with questions regarding bills from these companies.  You will be contacted with the lab results as soon as they are available. The fastest way to get your results is to activate your My Chart account. Instructions are located on the last page of this paperwork. If you have not heard from [place] regarding the results in 2 weeks, please contact this office.

## 2016-08-09 NOTE — Progress Notes (Signed)
PRIMARY CARE AT [place], [place] [address] [phone]  Date:  08/09/2016   Name:  Debra Mendoza   DOB:  January 25, 1966   MRN:  [id number]  PCP:  Joretta Bachelor, PA    History of Present Illness:  Debra Mendoza is a 51 y.o. female patient who presents to PCP with  Chief Complaint  Patient presents with  . Medication Refill    amlodipine and losartan     --requesting refill of the amlodipine and losartan.  She is taking the medication compliantly.  Does not notice any difficulty of side effects. No chest pains, dizziness, palpitations. She does have a hx of dyspnea, though she is having difficulty with her asthma.  She is taking the symbicort once per day with 1 puff.  Claimed it was too strong initially.  Not currently taking allergy medicine, though she is congestion, and seasonally allergic.    Patient Active Problem List   Diagnosis Date Noted  . Moderate asthma 03/08/2016  . Essential hypertension, benign 03/08/2016  . Numbness and tingling of left leg 11/04/2014  . HTN (hypertension) 07/09/2011  . Obesity 07/09/2011    Past Medical History:  Diagnosis Date  . Hypertension     Past Surgical History:  Procedure Laterality Date  . MYOMECTOMY    . [place] ABLATION      Social History  Substance Use Topics  . Smoking status: Never Smoker  . Smokeless tobacco: Never Used  . Alcohol use Yes     Comment: occassional     Family History  Problem Relation Age of Onset  . Hypertension Mother   . Hypertension Father   . Heart disease Father   . Hypertension Maternal Grandmother   . Breast cancer Paternal Aunt   . Breast cancer Maternal Aunt   . Colon cancer Neg Hx     No Known Allergies  Medication list has been reviewed and updated.  Current Outpatient Prescriptions on File Prior to Visit  Medication Sig Dispense Refill  . amLODipine (NORVASC) 5 MG tablet Take 1 tablet (5 mg total) by mouth daily. 90 tablet 3  . Ascorbic  Acid (VITAMIN C) 100 MG tablet Take 1,000 mg by mouth daily.     . Biotin 5000 MCG CAPS Take by mouth.    . Black Cohosh 40 MG CAPS Take 40 mg by mouth.    . budesonide-formoterol (SYMBICORT) 80-4.5 MCG/ACT inhaler Inhale 2 puffs into the lungs 2 (two) times daily. 1 Inhaler 5  . Calcium Carbonate-Vitamin D (CALCIUM 600+D PO) Take 1 tablet by mouth daily.    . Ipratropium-Albuterol (COMBIVENT) 20-100 MCG/ACT AERS respimat Inhale 1 puff into the lungs every 6 (six) hours. 4 g 2  . losartan-hydrochlorothiazide (HYZAAR) 50-12.5 MG tablet Take 1 tablet by mouth daily. 30 tablet 3  . norethindrone (AYGESTIN) 5 MG tablet Take 5 mg by mouth daily.    . potassium chloride (K-DUR,KLOR-CON) 10 MEQ tablet Take 10 mEq by mouth 2 (two) times daily.    Marland Kitchen albuterol (PROVENTIL HFA;VENTOLIN HFA) 108 (90 Base) MCG/ACT inhaler Inhale 2 puffs into the lungs 2 (two) times daily as needed for wheezing or shortness of breath (cough, shortness of breath or wheezing.). (Patient not taking: Reported on 08/09/2016) 1 Inhaler 1   No current facility-administered medications on file prior to visit.     ROS ROS otherwise unremarkable unless listed above.  Physical Examination: BP (!) 156/93 (BP Location: Right Arm, Patient Position: Sitting, Cuff Size: Large)  Pulse 82   Temp 98.8 F (37.1 C) (Oral)   Resp 18   Ht 5' (1.524 m)   Wt 249 lb 6.4 oz (113.1 kg)   SpO2 97%   BMI 48.71 kg/m  Ideal Body Weight: Weight in (lb) to have BMI = 25: 127.7  Physical Exam  Constitutional: She is oriented to person, place, and time. She appears well-developed and well-nourished. No distress.  HENT:  Head: Normocephalic and atraumatic.  Right Ear: Tympanic membrane, external ear and ear canal normal.  Left Ear: Tympanic membrane, external ear and ear canal normal.  Nose: Mucosal edema and rhinorrhea present. Right sinus exhibits no maxillary sinus tenderness and no frontal sinus tenderness. Left sinus exhibits no maxillary  sinus tenderness and no frontal sinus tenderness.  Mouth/Throat: No uvula swelling. No oropharyngeal exudate, posterior oropharyngeal edema or posterior oropharyngeal erythema.  Eyes: Conjunctivae and EOM are normal. Pupils are equal, round, and reactive to light.  Cardiovascular: Normal rate and regular rhythm.  Exam reveals no gallop, no distant heart sounds and no friction rub.   No murmur heard. Pulmonary/Chest: Effort normal. No respiratory distress. She has no decreased breath sounds. She has no wheezes. She has no rhonchi.  Lymphadenopathy:       Head (right side): No submandibular, no tonsillar, no preauricular and no posterior auricular adenopathy present.       Head (left side): No submandibular, no tonsillar, no preauricular and no posterior auricular adenopathy present.  Neurological: She is alert and oriented to person, place, and time.  Skin: She is not diaphoretic.  Psychiatric: She has a normal mood and affect. Her behavior is normal.     Assessment and Plan: Debra Mendoza is a 51 y.o. female who is here today for medication refill. Essential hypertension - Plan: CMP14+EGFR, losartan-hydrochlorothiazide (HYZAAR) 50-12.5 MG tablet, amLODipine (NORVASC) 5 MG tablet  Seasonal allergic rhinitis, unspecified trigger - Plan: cetirizine (ZYRTEC) 10 MG tablet  Moderate asthma without complication, unspecified whether persistent - Plan: budesonide-formoterol (SYMBICORT) 80-4.5 MCG/ACT inhaler  [name], PA-C [facility] 5/11/20188:49 PM

## 2016-12-26 ENCOUNTER — Other Ambulatory Visit: Payer: Self-pay | Admitting: Physician Assistant

## 2016-12-26 DIAGNOSIS — I10 Essential (primary) hypertension: Secondary | ICD-10-CM

## 2016-12-26 NOTE — Telephone Encounter (Signed)
Refill request for amlodipine 5 mg #30 with no refills approved.  Advised pt needs f/u bp ov and will send to schedulers to call and schedule f/u with [place].

## 2017-01-01 ENCOUNTER — Ambulatory Visit (INDEPENDENT_AMBULATORY_CARE_PROVIDER_SITE_OTHER): Payer: BC Managed Care – PPO | Admitting: Physician Assistant

## 2017-01-01 ENCOUNTER — Encounter: Payer: Self-pay | Admitting: Physician Assistant

## 2017-01-01 VITALS — BP 140/100 | HR 87 | Temp 98.8°F | Resp 16 | Ht 61.0 in | Wt 255.0 lb

## 2017-01-01 DIAGNOSIS — Z23 Encounter for immunization: Secondary | ICD-10-CM

## 2017-01-01 DIAGNOSIS — R232 Flushing: Secondary | ICD-10-CM | POA: Diagnosis not present

## 2017-01-01 DIAGNOSIS — N939 Abnormal uterine and vaginal bleeding, unspecified: Secondary | ICD-10-CM

## 2017-01-01 DIAGNOSIS — S46811A Strain of other muscles, fascia and tendons at shoulder and upper arm level, right arm, initial encounter: Secondary | ICD-10-CM

## 2017-01-01 MED ORDER — CYCLOBENZAPRINE HCL 10 MG PO TABS: 5.0000 mg | ORAL_TABLET | Freq: Three times a day (TID) | ORAL | 0 refills | Status: DC | PRN

## 2017-01-01 NOTE — Patient Instructions (Addendum)
I would like you to heat this area before performing stretches, then ice directly after the stretch You can try using a tennis ball to knead this pain. I would like you to avoid eating chips and processed snacks.  Do more eating of fresh fruits and vegetables, and lean meats. I am including the dash diet.    Mid-Back Strain Rehab Ask your health care provider which exercises are safe for you. Do exercises exactly as told by your health care provider and adjust them as directed. It is normal to feel mild stretching, pulling, tightness, or discomfort as you do these exercises, but you should stop right away if you feel sudden pain or your pain gets worse. Do not begin these exercises until told by your health care provider. Stretching and range of motion exercises This exercise warms up your muscles and joints and improves the movement and flexibility of your back and shoulders. This exercise also help to relieve pain. Exercise A: Chest and spine stretch  1. Lie down on your back on a firm surface. 2. Roll a towel or a small blanket so it is about 4 inches (10 cm) in diameter. 3. Put the towel lengthwise under the middle of your back so it is under your spine, but not under your shoulder blades. 4. To increase the stretch, you may put your hands behind your head and let your elbows fall to your sides. 5. Hold for __________ seconds. Repeat exercise __________ times. Complete this exercise __________ times a day. Strengthening exercises These exercises build strength and endurance in your back and your shoulder blade muscles. Endurance is the ability to use your muscles for a long time, even after they get tired. Exercise B: Alternating arm and leg raises  1. Get on your hands and knees on a firm surface. If you are on a hard floor, you may want to use padding to cushion your knees, such as an exercise mat. 2. Line up your arms and legs. Your hands should be below your shoulders, and your knees  should be below your hips. 3. Lift your left leg behind you. At the same time, raise your right arm and straighten it in front of you. ? Do not lift your leg higher than your hip. ? Do not lift your arm higher than your shoulder. ? Keep your abdominal and back muscles tight. ? Keep your hips facing the ground. ? Do not arch your back. ? Keep your balance carefully, and do not hold your breath. 4. Hold for __________ seconds. 5. Slowly return to the starting position and repeat with your right leg and your left arm. Repeat __________ times. Complete this exercise __________ times a day. Exercise C: Straight arm rows ( shoulder extension) 1. Stand with your feet shoulder width apart. 2. Secure an exercise band to a stable object in front of you so the band is at or above shoulder height. 3. Hold one end of the exercise band in each hand. 4. Straighten your elbows and lift your hands up to shoulder height. 5. Step back, away from the secured end of the exercise band, until the band stretches. 6. Squeeze your shoulder blades together and pull your hands down to the sides of your thighs. Stop when your hands are straight down by your sides. Do not let your hands go behind your body. 7. Hold for __________ seconds. 8. Slowly return to the starting position. Repeat __________ times. Complete this exercise __________ times a day. Exercise D:  Shoulder external rotation, prone 1. Lie on your abdomen on a firm bed so your left / right forearm hangs over the edge of the bed and your upper arm is on the bed, straight out from your body. ? Your elbow should be bent. ? Your palm should be facing your feet. 2. If instructed, hold a __________ weight in your hand. 3. Squeeze your shoulder blade toward the middle of your back. Do not let your shoulder lift toward your ear. 4. Keep your elbow bent in an "L" shape (90 degrees) while you slowly move your forearm up toward the ceiling. Move your forearm up to  the height of the bed, toward your head. ? Your upper arm should not move. ? At the top of the movement, your palm should face the floor. 5. Hold for __________ seconds. 6. Slowly return to the starting position and relax your muscles. Repeat __________ times. Complete this exercise __________ times a day. Exercise E: Scapular retraction and external rotation, rowing  1. Sit in a stable chair without armrests, or stand. 2. Secure an exercise band to a stable object in front of you so it is at shoulder height. 3. Hold one end of the exercise band in each hand. 4. Bring your arms out straight in front of you. 5. Step back, away from the secured end of the exercise band, until the band stretches. 6. Pull the band backward. As you do this, bend your elbows and squeeze your shoulder blades together, but avoid letting the rest of your body move. Do not let your shoulders lift up toward your ears. 7. Stop when your elbows are at your sides or slightly behind your body. 8. Hold for __________ seconds. 9. Slowly straighten your arms to return to the starting position. Repeat __________ times. Complete this exercise __________ times a day. Posture and body mechanics  Body mechanics refers to the movements and positions of your body while you do your daily activities. Posture is part of body mechanics. Good posture and healthy body mechanics can help to relieve stress in your body's tissues and joints. Good posture means that your spine is in its natural S-curve position (your spine is neutral), your shoulders are pulled back slightly, and your head is not tipped forward. The following are general guidelines for applying improved posture and body mechanics to your everyday activities. Standing   When standing, keep your spine neutral and your feet about hip-width apart. Keep a slight bend in your knees. Your ears, shoulders, and hips should line up.  When you do a task in which you lean forward while  standing in one place for a long time, place one foot up on a stable object that is 2-4 inches (5-10 cm) high, such as a footstool. This helps keep your spine neutral. Sitting   When sitting, keep your spine neutral and keep your feet flat on the floor. Use a footrest, if necessary, and keep your thighs parallel to the floor. Avoid rounding your shoulders, and avoid tilting your head forward.  When working at a desk or a computer, keep your desk at a height where your hands are slightly lower than your elbows. Slide your chair under your desk so you are close enough to maintain good posture.  When working at a computer, place your monitor at a height where you are looking straight ahead and you do not have to tilt your head forward or downward to look at the screen. Resting  When lying  down and resting, avoid positions that are most painful for you.  If you have pain with activities such as sitting, bending, stooping, or squatting (flexion-based activities), lie in a position in which your body does not bend very much. For example, avoid curling up on your side with your arms and knees near your chest (fetal position).  If you have pain with activities such as standing for a long time or reaching with your arms (extension-based activities), lie with your spine in a neutral position and bend your knees slightly. Try the following positions:  Lying on your side with a pillow between your knees.  Lying on your back with a pillow under your knees.  Lifting   When lifting objects, keep your feet at least shoulder-width apart and tighten your abdominal muscles.  Bend your knees and hips and keep your spine neutral. It is important to lift using the strength of your legs, not your back. Do not lock your knees straight out.  Always ask for help to lift heavy or awkward objects. This information is not intended to replace advice given to you by your health care provider. Make sure you discuss any  questions you have with your health care provider. Document Released: 03/19/2005 Document Revised: 11/24/2015 Document Reviewed: 12/29/2014 Elsevier Interactive Patient Education  2018 [employer].      IF you received an x-ray today, you will receive an invoice from [facility]. Please contact [facility] at [phone] with questions or concerns regarding your invoice.   IF you received labwork today, you will receive an invoice from [facility]. Please contact [facility] at [phone] with questions or concerns regarding your invoice.   Our billing staff will not be able to assist you with questions regarding bills from these companies.  You will be contacted with the lab results as soon as they are available. The fastest way to get your results is to activate your My Chart account. Instructions are located on the last page of this paperwork. If you have not heard from [place] regarding the results in 2 weeks, please contact this office.

## 2017-01-01 NOTE — Progress Notes (Signed)
PRIMARY CARE AT [place], [place] [address] [phone]  Date:  01/01/2017   Name:  Debra Mendoza   DOB:  December 30, 1965   MRN:  [id number]  PCP:  Joretta Bachelor, PA    History of Present Illness:  Debra Mendoza is a 51 y.o. female patient who presents to PCP with  Chief Complaint  Patient presents with  . Back Pain    upper right x 2 mos     3 monthys of right sided pain under her shoulder blade that has progressively worsened for the last 1 month ago.  Sensation is a pulling effect.   No dysuria, but does have increased frequency.   She has had significant hot flashes for the last month.   She is driving a bus, and works in [employer].  She has tried no ice or heat to the back.     She is taking 2 tablets of the norethindrone after having more break through bleeding.  She has had an ablation, but had some vaginal bleeding.    Ibuprofen would help very temporarily.    Patient Active Problem List   Diagnosis Date Noted  . Moderate asthma 03/08/2016  . Essential hypertension, benign 03/08/2016  . Numbness and tingling of left leg 11/04/2014  . HTN (hypertension) 07/09/2011  . Obesity 07/09/2011    Past Medical History:  Diagnosis Date  . Hypertension     Past Surgical History:  Procedure Laterality Date  . MYOMECTOMY    . [place] ABLATION      Social History  Substance Use Topics  . Smoking status: Never Smoker  . Smokeless tobacco: Never Used  . Alcohol use Yes     Comment: occassional     Family History  Problem Relation Age of Onset  . Hypertension Mother   . Hypertension Father   . Heart disease Father   . Hypertension Maternal Grandmother   . Breast cancer Paternal Aunt   . Breast cancer Maternal Aunt   . Colon cancer Neg Hx     No Known Allergies  Medication list has been reviewed and updated.  Current Outpatient Prescriptions on File Prior to Visit  Medication Sig Dispense Refill  . amLODipine (NORVASC) 5  MG tablet TAKE 1 TABLET BY MOUTH ONCE DAILY 30 tablet 0  . Ascorbic Acid (VITAMIN C) 100 MG tablet Take 1,000 mg by mouth daily.     . Biotin 5000 MCG CAPS Take by mouth.    . Black Cohosh 40 MG CAPS Take 40 mg by mouth.    . budesonide-formoterol (SYMBICORT) 80-4.5 MCG/ACT inhaler Inhale 2 puffs into the lungs 2 (two) times daily. 1 Inhaler 5  . Calcium Carbonate-Vitamin D (CALCIUM 600+D PO) Take 1 tablet by mouth daily.    . cetirizine (ZYRTEC) 10 MG tablet Take 1 tablet (10 mg total) by mouth daily. 30 tablet 5  . losartan-hydrochlorothiazide (HYZAAR) 50-12.5 MG tablet Take 1 tablet by mouth daily. 30 tablet 5  . norethindrone (AYGESTIN) 5 MG tablet Take 5 mg by mouth daily.    . potassium chloride (K-DUR,KLOR-CON) 10 MEQ tablet Take 10 mEq by mouth 2 (two) times daily.    Marland Kitchen albuterol (PROVENTIL HFA;VENTOLIN HFA) 108 (90 Base) MCG/ACT inhaler Inhale 2 puffs into the lungs 2 (two) times daily as needed for wheezing or shortness of breath (cough, shortness of breath or wheezing.). (Patient not taking: Reported on 08/09/2016) 1 Inhaler 1  . Ipratropium-Albuterol (COMBIVENT) 20-100 MCG/ACT AERS respimat  Inhale 1 puff into the lungs every 6 (six) hours. (Patient not taking: Reported on 01/01/2017) 4 g 2   No current facility-administered medications on file prior to visit.     ROS ROS otherwise unremarkable unless listed above.  Physical Examination: BP (!) 140/100   Temp 98.8 F (37.1 C) (Oral)   Resp 16   Ht 5\' 1"  (1.549 m)   Wt 255 lb (115.7 kg)   BMI 48.18 kg/m  Ideal Body Weight: Weight in (lb) to have BMI = 25: 132  Physical Exam  Constitutional: She is oriented to person, place, and time. She appears well-developed and well-nourished. No distress.  HENT:  Head: Normocephalic and atraumatic.  Right Ear: External ear normal.  Left Ear: External ear normal.  Eyes: Pupils are equal, round, and reactive to light. Conjunctivae and EOM are normal.  Cardiovascular: Normal rate and  regular rhythm.   Pulmonary/Chest: Effort normal. No respiratory distress.  Musculoskeletal:  Upper back with right sided trapezius tenderness.  Normal upper extremity rom.  Normal cervical rom.  No spinous tenderness.    Neurological: She is alert and oriented to person, place, and time.  Skin: She is not diaphoretic.  Psychiatric: She has a normal mood and affect. Her behavior is normal.     Assessment and Plan: Debra Mendoza is a 51 y.o. female who is here today for cc of  Chief Complaint  Patient presents with  . Back Pain    upper right x 2 mos    this appears to be a muscle strain.   She will follow up with her gynecologist in regards to the bleeding and hotflashes.  I will place referral for this at this time.   She declines any added use of an ssri at this time, to help offset her mood symptoms.  This may be the increase of norethindrone, which may need to be reassessed.  Thank you for seeing her.    Flu vaccine need - Plan: Flu Vaccine QUAD 36+ mos IM  Strain of right trapezius muscle, initial encounter - Plan: cyclobenzaprine (FLEXERIL) 10 MG tablet  Vaginal bleeding - Plan: Ambulatory referral to Gynecology  Hot flashes - Plan: Ambulatory referral to Gynecology  [name], PA-C [facility] 10/5/20188:05 AM

## 2017-02-07 ENCOUNTER — Other Ambulatory Visit: Payer: Self-pay | Admitting: Physician Assistant

## 2017-02-07 DIAGNOSIS — I10 Essential (primary) hypertension: Secondary | ICD-10-CM

## 2017-02-27 ENCOUNTER — Other Ambulatory Visit: Payer: Self-pay

## 2017-02-27 ENCOUNTER — Encounter: Payer: Self-pay | Admitting: Physician Assistant

## 2017-02-27 ENCOUNTER — Ambulatory Visit: Payer: BC Managed Care – PPO | Admitting: Physician Assistant

## 2017-02-27 VITALS — BP 158/88 | HR 91 | Temp 98.2°F | Resp 18 | Ht 61.0 in | Wt 255.0 lb

## 2017-02-27 DIAGNOSIS — I10 Essential (primary) hypertension: Secondary | ICD-10-CM

## 2017-02-27 DIAGNOSIS — G4700 Insomnia, unspecified: Secondary | ICD-10-CM | POA: Diagnosis not present

## 2017-02-27 MED ORDER — LOSARTAN POTASSIUM-HCTZ 100-12.5 MG PO TABS: 1.0000 | ORAL_TABLET | Freq: Every day | ORAL | 1 refills | Status: DC

## 2017-02-27 NOTE — Progress Notes (Signed)
PRIMARY CARE AT [place], [place] [address] [phone]  Date:  02/27/2017   Name:  Debra Mendoza   DOB:  1965/05/31   MRN:  [id number]  PCP:  Joretta Bachelor, PA    History of Present Illness:  Debra Mendoza is a 51 y.o. female patient who presents to PCP with  Chief Complaint  Patient presents with  . Hypertension    check      Patient is here for bp check.  She has been without her bp medications for 2 weeks.  She notes that she has been considerably busy and stressed today.  She went to the gynecologist where she was advised that surgical intervention for her fibroids would be a hysterectomy.  She reported that she still wanted to be able to have children.  She does not have any at this time.  She did not let the gynecologist that this was her desire.  She does not report this to anyone.    Patient Active Problem List   Diagnosis Date Noted  . Moderate asthma 03/08/2016  . Essential hypertension, benign 03/08/2016  . Numbness and tingling of left leg 11/04/2014  . HTN (hypertension) 07/09/2011  . Obesity 07/09/2011    Past Medical History:  Diagnosis Date  . Hypertension     Past Surgical History:  Procedure Laterality Date  . MYOMECTOMY    . [place] ABLATION      Social History   Tobacco Use  . Smoking status: Never Smoker  . Smokeless tobacco: Never Used  Substance Use Topics  . Alcohol use: Yes    Comment: occassional   . Drug use: No    Family History  Problem Relation Age of Onset  . Hypertension Mother   . Hypertension Father   . Heart disease Father   . Hypertension Maternal Grandmother   . Breast cancer Paternal Aunt   . Breast cancer Maternal Aunt   . Colon cancer Neg Hx     No Known Allergies  Medication list has been reviewed and updated.  Current Outpatient Medications on File Prior to Visit  Medication Sig Dispense Refill  . albuterol (PROVENTIL HFA;VENTOLIN HFA) 108 (90 Base) MCG/ACT inhaler  Inhale 2 puffs into the lungs 2 (two) times daily as needed for wheezing or shortness of breath (cough, shortness of breath or wheezing.). 1 Inhaler 1  . amLODipine (NORVASC) 5 MG tablet TAKE 1 TABLET BY MOUTH ONCE DAILY *NEED  OFFICE  VISIT  FOR  FOLLOW  UP  ON  BLOOD  PRESSURE* 15 tablet 0  . Ascorbic Acid (VITAMIN C) 100 MG tablet Take 1,000 mg by mouth daily.     . Biotin 5000 MCG CAPS Take by mouth.    . Black Cohosh 40 MG CAPS Take 40 mg by mouth.    . budesonide-formoterol (SYMBICORT) 80-4.5 MCG/ACT inhaler Inhale 2 puffs into the lungs 2 (two) times daily. 1 Inhaler 5  . Calcium Carbonate-Vitamin D (CALCIUM 600+D PO) Take 1 tablet by mouth daily.    . cetirizine (ZYRTEC) 10 MG tablet Take 1 tablet (10 mg total) by mouth daily. 30 tablet 5  . cyclobenzaprine (FLEXERIL) 10 MG tablet Take 0.5-1 tablets (5-10 mg total) by mouth 3 (three) times daily as needed. 30 tablet 0  . Ipratropium-Albuterol (COMBIVENT) 20-100 MCG/ACT AERS respimat Inhale 1 puff into the lungs every 6 (six) hours. 4 g 2  . norethindrone (AYGESTIN) 5 MG tablet Take 5 mg by mouth daily.    Marland Kitchen  potassium chloride (K-DUR,KLOR-CON) 10 MEQ tablet Take 10 mEq by mouth 2 (two) times daily.     No current facility-administered medications on file prior to visit.     ROS ROS otherwise unremarkable unless listed above.  Physical Examination: BP (!) 158/88   Pulse 91   Temp 98.2 F (36.8 C) (Oral)   Resp 18   Ht 5\' 1"  (1.549 m)   Wt 255 lb (115.7 kg)   SpO2 97%   BMI 48.18 kg/m  Ideal Body Weight: Weight in (lb) to have BMI = 25: 132  Physical Exam  Constitutional: She is oriented to person, place, and time. She appears well-developed and well-nourished. No distress.  HENT:  Head: Normocephalic and atraumatic.  Right Ear: External ear normal.  Left Ear: External ear normal.  Eyes: Conjunctivae and EOM are normal. Pupils are equal, round, and reactive to light.  Cardiovascular: Normal rate, regular rhythm, normal  heart sounds and normal pulses. Exam reveals no friction rub.  No murmur heard. Pulmonary/Chest: Effort normal. No respiratory distress.  Neurological: She is alert and oriented to person, place, and time.  Skin: She is not diaphoretic.  Psychiatric: She has a normal mood and affect. Her behavior is normal.     Assessment and Plan: Debra Mendoza is a 51 y.o. female who is here today for cc of  Chief Complaint  Patient presents with  . Hypertension    check    Insomnia, unspecified type - Plan: Ambulatory referral to Sleep Studies  Essential hypertension, benign - Plan: losartan-hydrochlorothiazide (HYZAAR) 100-12.5 MG tablet  [name], PA-C [facility] 12/4/20188:07 AM

## 2017-02-27 NOTE — Patient Instructions (Addendum)
I would like you to follow up with me in 2-4 weeks for recheck.   Check your blood pressure at home.  If this is over 140/90 by 2 weeks, come sooner.   Please review the dash diet. I would like you to make sure you keep your sodium intake down.     IF you received an x-ray today, you will receive an invoice from [facility]. Please contact [facility] at [phone] with questions or concerns regarding your invoice.   IF you received labwork today, you will receive an invoice from [facility]. Please contact [facility] at [phone] with questions or concerns regarding your invoice.   Our billing staff will not be able to assist you with questions regarding bills from these companies.  You will be contacted with the lab results as soon as they are available. The fastest way to get your results is to activate your My Chart account. Instructions are located on the last page of this paperwork. If you have not heard from [place] regarding the results in 2 weeks, please contact this office.

## 2017-03-17 ENCOUNTER — Other Ambulatory Visit: Payer: Self-pay | Admitting: Physician Assistant

## 2017-03-17 DIAGNOSIS — I10 Essential (primary) hypertension: Secondary | ICD-10-CM

## 2017-03-18 ENCOUNTER — Other Ambulatory Visit: Payer: Self-pay | Admitting: Obstetrics and Gynecology

## 2017-03-18 DIAGNOSIS — D250 Submucous leiomyoma of uterus: Secondary | ICD-10-CM

## 2017-03-20 ENCOUNTER — Ambulatory Visit: Payer: BC Managed Care – PPO | Admitting: Physician Assistant

## 2017-03-21 ENCOUNTER — Ambulatory Visit: Payer: BC Managed Care – PPO | Admitting: Physician Assistant

## 2017-03-21 ENCOUNTER — Other Ambulatory Visit: Payer: Self-pay

## 2017-03-21 VITALS — BP 154/78 | HR 96 | Temp 98.7°F | Resp 16 | Ht 61.0 in | Wt 255.0 lb

## 2017-03-21 DIAGNOSIS — I10 Essential (primary) hypertension: Secondary | ICD-10-CM | POA: Diagnosis not present

## 2017-03-21 LAB — CMP14+EGFR
A/G RATIO: 1.6 (ref 1.2–2.2)
ALBUMIN: 4.7 g/dL (ref 3.5–5.5)
ALT: 28 IU/L (ref 0–32)
AST: 30 IU/L (ref 0–40)
Alkaline Phosphatase: 23 IU/L — ABNORMAL LOW (ref 39–117)
BUN/Creatinine Ratio: 13 (ref 9–23)
BUN: 11 mg/dL (ref 6–24)
Bilirubin Total: 0.7 mg/dL (ref 0.0–1.2)
CALCIUM: 10 mg/dL (ref 8.7–10.2)
CO2: 19 mmol/L — ABNORMAL LOW (ref 20–29)
Chloride: 102 mmol/L (ref 96–106)
Creatinine, Ser: 0.86 mg/dL (ref 0.57–1.00)
GFR, EST AFRICAN AMERICAN: 91 mL/min/{1.73_m2} (ref >59)
GFR, EST NON AFRICAN AMERICAN: 79 mL/min/{1.73_m2} (ref >59)
GLOBULIN, TOTAL: 3 g/dL (ref 1.5–4.5)
Glucose: 90 mg/dL (ref 65–99)
POTASSIUM: 3.9 mmol/L (ref 3.5–5.2)
SODIUM: 138 mmol/L (ref 134–144)
TOTAL PROTEIN: 7.7 g/dL (ref 6.0–8.5)

## 2017-03-21 LAB — LIPID PANEL
CHOL/HDL RATIO: 7.9 ratio — AB (ref 0.0–4.4)
Cholesterol, Total: 173 mg/dL (ref 100–199)
HDL: 22 mg/dL — AB (ref >39)
LDL Calculated: 135 mg/dL — ABNORMAL HIGH (ref 0–99)
Triglycerides: 81 mg/dL (ref 0–149)
VLDL Cholesterol Cal: 16 mg/dL (ref 5–40)

## 2017-03-21 MED ORDER — AMLODIPINE BESYLATE 5 MG PO TABS: ORAL_TABLET | ORAL | 3 refills | Status: DC

## 2017-03-21 MED ORDER — LOSARTAN POTASSIUM-HCTZ 100-12.5 MG PO TABS: 1.0000 | ORAL_TABLET | Freq: Every day | ORAL | 1 refills | Status: DC

## 2017-03-21 NOTE — Progress Notes (Signed)
PRIMARY CARE AT [place], [place] [address] [phone]  Date:  03/21/2017   Name:  Debra Mendoza   DOB:  11-08-1965   MRN:  [id number]  PCP:  Joretta Bachelor, PA    History of Present Illness:  Debra Mendoza is a 51 y.o. female patient who presents to PCP with  Chief Complaint  Patient presents with  . Follow-up    HTN  . Medication Refill    amlodipine     She is cutting back on her chips.  She is attempting to cut back on processed foods.  She is not exercising at this time.  She is also concerned of mouth dryness.  This can be at any time.  She is hydrating well with water, she reports.  No abnormal taste.    Patient Active Problem List   Diagnosis Date Noted  . Moderate asthma 03/08/2016  . Essential hypertension, benign 03/08/2016  . Numbness and tingling of left leg 11/04/2014  . HTN (hypertension) 07/09/2011  . Obesity 07/09/2011    Past Medical History:  Diagnosis Date  . Hypertension     Past Surgical History:  Procedure Laterality Date  . MYOMECTOMY    . NOVASURE ABLATION      Social History   Tobacco Use  . Smoking status: Never Smoker  . Smokeless tobacco: Never Used  Substance Use Topics  . Alcohol use: Yes    Comment: occassional   . Drug use: No    Family History  Problem Relation Age of Onset  . Hypertension Mother   . Hypertension Father   . Heart disease Father   . Hypertension Maternal Grandmother   . Breast cancer Paternal Aunt   . Breast cancer Maternal Aunt   . Colon cancer Neg Hx     No Known Allergies  Medication list has been reviewed and updated.  Current Outpatient Medications on File Prior to Visit  Medication Sig Dispense Refill  . albuterol (PROVENTIL HFA;VENTOLIN HFA) 108 (90 Base) MCG/ACT inhaler Inhale 2 puffs into the lungs 2 (two) times daily as needed for wheezing or shortness of breath (cough, shortness of breath or wheezing.). 1 Inhaler 1  . Ascorbic Acid (VITAMIN C) 100 MG  tablet Take 1,000 mg by mouth daily.     . Biotin 5000 MCG CAPS Take by mouth.    . Black Cohosh 40 MG CAPS Take 40 mg by mouth.    . budesonide-formoterol (SYMBICORT) 80-4.5 MCG/ACT inhaler Inhale 2 puffs into the lungs 2 (two) times daily. 1 Inhaler 5  . Calcium Carbonate-Vitamin D (CALCIUM 600+D PO) Take 1 tablet by mouth daily.    . cetirizine (ZYRTEC) 10 MG tablet Take 1 tablet (10 mg total) by mouth daily. 30 tablet 5  . cyclobenzaprine (FLEXERIL) 10 MG tablet Take 0.5-1 tablets (5-10 mg total) by mouth 3 (three) times daily as needed. 30 tablet 0  . Ipratropium-Albuterol (COMBIVENT) 20-100 MCG/ACT AERS respimat Inhale 1 puff into the lungs every 6 (six) hours. 4 g 2  . norethindrone (AYGESTIN) 5 MG tablet Take 5 mg by mouth daily.    . potassium chloride (K-DUR,KLOR-CON) 10 MEQ tablet Take 10 mEq by mouth 2 (two) times daily.     No current facility-administered medications on file prior to visit.     ROS ROS otherwise unremarkable unless listed above.  Physical Examination: BP (!) 154/78   Pulse 96   Temp 98.7 F (37.1 C) (Oral)   Resp 16  Ht '5\' 1"'$  (1.549 m)   Wt 255 lb (115.7 kg)   SpO2 97%   BMI 48.18 kg/m  Ideal Body Weight: Weight in (lb) to have BMI = 25: 132 Wt Readings from Last 3 Encounters:  03/21/17 255 lb (115.7 kg)  02/27/17 255 lb (115.7 kg)  01/01/17 255 lb (115.7 kg)    Physical Exam  Constitutional: She is oriented to person, place, and time. She appears well-developed and well-nourished. No distress.  HENT:  Head: Normocephalic and atraumatic.  Right Ear: External ear normal.  Left Ear: External ear normal.  Eyes: Conjunctivae and EOM are normal. Pupils are equal, round, and reactive to light.  Cardiovascular: Normal rate and regular rhythm. Exam reveals no friction rub.  No murmur heard. Pulmonary/Chest: Effort normal and breath sounds normal. No respiratory distress.  Neurological: She is alert and oriented to person, place, and time.  Skin:  She is not diaphoretic.  Psychiatric: She has a normal mood and affect. Her behavior is normal.     Assessment and Plan: Debra Mendoza is a 51 y.o. female who is here today for cc of  Chief Complaint  Patient presents with  . Follow-up    HTN  . Medication Refill    amlodipine   Will try biotene.   Essential hypertension - Plan: amLODipine (NORVASC) 5 MG tablet, CMP14+EGFR, Lipid panel  Essential hypertension, benign - Plan: losartan-hydrochlorothiazide (HYZAAR) 100-12.5 MG tablet  [name], PA-C [facility] 12/30/20182:13 PM

## 2017-03-21 NOTE — Patient Instructions (Addendum)
Please try biotene, over the counter for this mouth dryness. I would like you to follow up in 3-6 months for annual physical Sodium Fluoride dental gel What is this medicine? SODIUM FLUORIDE (SOE dee um FLOOR ide) gel is used to prevent tooth decay. It also helps to decrease sensitivity of the teeth. This medicine may be used for other purposes; ask your health care provider or pharmacist if you have questions. COMMON BRAND NAME(S): biotene Dry Mouth, Dentagel, Dental Resources Neutral, EtheDent, Fluorident, Fluorishield (Sodium Fluoride), FluoroCare Neutral, Karigel, Karigel-N, Neutracare, Neutragard, Neutral, Neutral One Minute, Perfect Choice Neutral, Phos-Flur, Plus Neutral, PreviDent, PreviDent 5000 Booster, PreviDent 5000 Booster Plus, PreviDent 5000 Dry Mouth, Sultan/T[place]x Neutral Ph, Thera-Flur, Thera-Flur N What should I tell my health care provider before I take this medicine? They need to know if you have any of these conditions: -kidney disease -stained, mottled, or pitted teeth -an unusual or allergic reaction to fluoride, other medicines, foods, dyes, or preservatives -pregnant or trying to get pregnant -breast-feeding How should I use this medicine? This medicine is applied to the teeth. Follow these instructions unless otherwise instructed by your dental professional: After brushing thoroughly with toothpaste, rinse as usual. Apply a thin ribbon of gel to the teeth with a toothbrush or mouth tray. Do this once daily for at least one minute, preferably at bedtime. After using, adults should spit out the gel and should not eat, drink or rinse for 30 minutes for best results. Do not swallow the gel. Talk to your pediatrician or dental professional regarding the use of this medicine in children. This gel has been used in children as young as 75 years of age, however, precautions do apply. Children of 67 to 48 years of age should spit the gel out after use and rinse the mouth  thoroughly. Overdosage: If you think you have taken too much of this medicine contact a poison control center or emergency room at once. NOTE: This medicine is only for you. Do not share this medicine with others. What if I miss a dose? If you miss a dose, use it as soon as you can. If it is almost time for your next dose, use only that dose. Do not use double or extra doses. What may interact with this medicine? Interactions are not expected. This list may not describe all possible interactions. Give your health care provider a list of all the medicines, herbs, non-prescription drugs, or dietary supplements you use. Also tell them if you smoke, drink alcohol, or use illegal drugs. Some items may interact with your medicine. What should I watch for while using this medicine? Visit your dental professional for regular checks on your project. Do not swallow this gel. Prolonged daily ingestion can cause abnormal tooth enamel. Enamel is the hard part on the outside of the tooth. Report any changes in tooth color, such as unusual staining or spotting of the teeth. What side effects may I notice from receiving this medicine? Side effects that you should report to your doctor or health care professional as soon as possible: -allergic reactions like skin rash, itching or hives, swelling of the face, lips, or tongue -burning in the mouth, sore tongue -nausea, vomiting, diarrhea Side effects that usually do not require medical attention (report to your doctor or health care professional if they continue or are bothersome): -sensitive, irritated gums This list may not describe all possible side effects. Call your doctor for medical advice about side effects. You may report side effects to  FDA at 1-800-FDA-1088. Where should I keep my medicine? Keep out of the reach of children. Store at room temperature between 20 and 25 degrees C (68 and 77 degrees F). Keep container tightly closed. Throw away any unused  medicine after the expiration date. NOTE: This sheet is a summary. It may not cover all possible information. If you have questions about this medicine, talk to your doctor, pharmacist, or health care provider.  2018 Elsevier/Gold Standard (2015-04-21 11:12:28)     IF you received an x-ray today, you will receive an invoice from [facility]. Please contact [facility] at [phone] with questions or concerns regarding your invoice.   IF you received labwork today, you will receive an invoice from [place]. Please contact [facility] at [phone] with questions or concerns regarding your invoice.   Our billing staff will not be able to assist you with questions regarding bills from these companies.  You will be contacted with the lab results as soon as they are available. The fastest way to get your results is to activate your My Chart account. Instructions are located on the last page of this paperwork. If you have not heard from [place] regarding the results in 2 weeks, please contact this office.

## 2017-03-21 NOTE — Progress Notes (Deleted)
PRIMARY CARE AT [place], [place] [address] [phone]  Date:  03/21/2017   Name:  Debra Mendoza   DOB:  19-Aug-1965   MRN:  [id number]  PCP:  Joretta Bachelor, PA    History of Present Illness:  Debra Mendoza is a 51 y.o. female patient who presents to PCP with  Chief Complaint  Patient presents with  . Follow-up    HTN  . Medication Refill    amlodipine       Patient Active Problem List   Diagnosis Date Noted  . Moderate asthma 03/08/2016  . Essential hypertension, benign 03/08/2016  . Numbness and tingling of left leg 11/04/2014  . HTN (hypertension) 07/09/2011  . Obesity 07/09/2011    Past Medical History:  Diagnosis Date  . Hypertension     Past Surgical History:  Procedure Laterality Date  . MYOMECTOMY    . NOVASURE ABLATION      Social History   Tobacco Use  . Smoking status: Never Smoker  . Smokeless tobacco: Never Used  Substance Use Topics  . Alcohol use: Yes    Comment: occassional   . Drug use: No    Family History  Problem Relation Age of Onset  . Hypertension Mother   . Hypertension Father   . Heart disease Father   . Hypertension Maternal Grandmother   . Breast cancer Paternal Aunt   . Breast cancer Maternal Aunt   . Colon cancer Neg Hx     No Known Allergies  Medication list has been reviewed and updated.  Current Outpatient Medications on File Prior to Visit  Medication Sig Dispense Refill  . albuterol (PROVENTIL HFA;VENTOLIN HFA) 108 (90 Base) MCG/ACT inhaler Inhale 2 puffs into the lungs 2 (two) times daily as needed for wheezing or shortness of breath (cough, shortness of breath or wheezing.). 1 Inhaler 1  . amLODipine (NORVASC) 5 MG tablet TAKE 1 TABLET BY MOUTH ONCE DAILY **NEED  OFFICE  VISIT  FOR  FOLLOW  UP  ON  BLOOD  PRESSURE** 15 tablet 0  . Ascorbic Acid (VITAMIN C) 100 MG tablet Take 1,000 mg by mouth daily.     . Biotin 5000 MCG CAPS Take by mouth.    . Black Cohosh 40 MG CAPS Take  40 mg by mouth.    . budesonide-formoterol (SYMBICORT) 80-4.5 MCG/ACT inhaler Inhale 2 puffs into the lungs 2 (two) times daily. 1 Inhaler 5  . Calcium Carbonate-Vitamin D (CALCIUM 600+D PO) Take 1 tablet by mouth daily.    . cetirizine (ZYRTEC) 10 MG tablet Take 1 tablet (10 mg total) by mouth daily. 30 tablet 5  . cyclobenzaprine (FLEXERIL) 10 MG tablet Take 0.5-1 tablets (5-10 mg total) by mouth 3 (three) times daily as needed. 30 tablet 0  . Ipratropium-Albuterol (COMBIVENT) 20-100 MCG/ACT AERS respimat Inhale 1 puff into the lungs every 6 (six) hours. 4 g 2  . losartan-hydrochlorothiazide (HYZAAR) 100-12.5 MG tablet Take 1 tablet by mouth daily. 30 tablet 1  . norethindrone (AYGESTIN) 5 MG tablet Take 5 mg by mouth daily.    . potassium chloride (K-DUR,KLOR-CON) 10 MEQ tablet Take 10 mEq by mouth 2 (two) times daily.     No current facility-administered medications on file prior to visit.     ROS ROS otherwise unremarkable unless listed above.  Physical Examination: BP (!) 154/78   Pulse 96   Temp 98.7 F (37.1 C) (Oral)   Resp 16   Ht 5\' 1"  (  1.549 m)   Wt 255 lb (115.7 kg)   SpO2 97%   BMI 48.18 kg/m  Ideal Body Weight: Weight in (lb) to have BMI = 25: 132  Physical Exam  Constitutional: She is oriented to person, place, and time. She appears well-developed and well-nourished. No distress.  HENT:  Head: Normocephalic and atraumatic.  Right Ear: External ear normal.  Left Ear: External ear normal.  Eyes: Conjunctivae and EOM are normal. Pupils are equal, round, and reactive to light.  Cardiovascular: Normal rate, regular rhythm, normal heart sounds and intact distal pulses. Exam reveals no friction rub.  No murmur heard. Pulmonary/Chest: Effort normal and breath sounds normal. No respiratory distress.  Neurological: She is alert and oriented to person, place, and time.  Skin: She is not diaphoretic.  Psychiatric: She has a normal mood and affect. Her behavior is normal.      Assessment and Plan: Debra Mendoza is a 51 y.o. female who is here today for cc of  Chief Complaint  Patient presents with  . Follow-up    HTN  . Medication Refill    amlodipine    There are no diagnoses linked to this encounter.  [name], PA-C [facility] 03/21/2017 10:05 AM

## 2017-03-31 ENCOUNTER — Encounter: Payer: Self-pay | Admitting: Physician Assistant

## 2017-04-09 ENCOUNTER — Encounter: Payer: Self-pay | Admitting: Radiology

## 2017-04-09 ENCOUNTER — Other Ambulatory Visit (HOSPITAL_COMMUNITY): Payer: Self-pay | Admitting: Interventional Radiology

## 2017-04-09 ENCOUNTER — Ambulatory Visit
Admission: RE | Admit: 2017-04-09 | Discharge: 2017-04-09 | Disposition: A | Discharge status: Home / Self Care | Payer: BC Managed Care – PPO | Source: Ambulatory Visit | Attending: Obstetrics and Gynecology | Admitting: Obstetrics and Gynecology

## 2017-04-09 DIAGNOSIS — D250 Submucous leiomyoma of uterus: Secondary | ICD-10-CM

## 2017-04-09 HISTORY — PX: IR RADIOLOGIST EVAL & MGMT: IMG5224

## 2017-04-09 NOTE — Consult Note (Signed)
Chief Complaint: Patient was seen in consultation today for uterine fibroid embolization at the request of [place]  Referring Physician(s): Sampedro,Dionne  History of Present Illness: Debra Mendoza is a 52 y.o. G80P0 African American female with a history of multiple uterine fibroids dating back at least 10 years. She did have severe menorrhagia in the past and is status post endometrial ablation approximately 8-9 years ago. Since that time, she has not had regular menstrual cycles. She did have some significant bleeding late last year and was placed on Aygestin. After increasing her dose from 5 mg to 7.5 mg, this has helped with bleeding which she now describes as fairly light and lasting a day or two. Irregular bleeding then typically comes back in a week or two. She did not tolerate a 10 mg daily dosing.  Ms. Puopolo is perimenopausal with daily and nightly hot flashes. She has additional symptoms of abdominal discomfort, particularly towards the left, bloating and back pain. She does not have significant urinary symptoms. Ultrasound on 02/27/2017 demonstrated an enlarged uterus with a dominant 11 cm posterior fundal fibroid and a separate smaller area of shadowing calcification measuring up to 1.5 cm and likely representing a degenerated calcified fibroid. The endometrium is distorted on the ultrasound study. No ovarian pathology was identified. Pap smear in November was negative. She has not had an endometrial biopsy.  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  Past Medical History:  Diagnosis Date  . Hypertension     Past Surgical History:  Procedure Laterality Date  . IR RADIOLOGIST EVAL & MGMT  04/09/2017  . MYOMECTOMY    . NOVASURE ABLATION      Allergies: Patient has no known allergies.  Medications: Prior to Admission medications   Medication Sig Start Date End Date Taking? Authorizing Provider    albuterol (PROVENTIL HFA;VENTOLIN HFA) 108 (90 Base) MCG/ACT inhaler Inhale 2 puffs into the lungs 2 (two) times daily as needed for wheezing or shortness of breath (cough, shortness of breath or wheezing.). 09/15/15   Jaynee Eagles, PA-C  amLODipine (NORVASC) 5 MG tablet TAKE 1 TABLET BY MOUTH ONCE DAILY 03/21/17   [name] D, PA  Ascorbic Acid (VITAMIN C) 100 MG tablet Take 1,000 mg by mouth daily.     [provider]  Biotin 5000 MCG CAPS Take by mouth.    [provider]  Black Cohosh 40 MG CAPS Take 40 mg by mouth.    [provider]  budesonide-formoterol (SYMBICORT) 80-4.5 MCG/ACT inhaler Inhale 2 puffs into the lungs 2 (two) times daily. 08/09/16   [name] D, PA  Calcium Carbonate-Vitamin D (CALCIUM 600+D PO) Take 1 tablet by mouth daily.    [provider]  cetirizine (ZYRTEC) 10 MG tablet Take 1 tablet (10 mg total) by mouth daily. 08/09/16   [name] D, PA  cyclobenzaprine (FLEXERIL) 10 MG tablet Take 0.5-1 tablets (5-10 mg total) by mouth 3 (three) times daily as needed. 01/01/17   [name] D, PA  Ipratropium-Albuterol (COMBIVENT) 20-100 MCG/ACT AERS respimat Inhale 1 puff into the lungs every 6 (six) hours. 03/03/16   [name] D, PA  losartan-hydrochlorothiazide (HYZAAR) 100-12.5 MG tablet Take 1 tablet by mouth daily. 03/21/17   [name] D, PA  norethindrone (AYGESTIN) 5 MG tablet Take 5 mg by mouth daily.    [provider]  potassium chloride (K-DUR,KLOR-CON) 10 MEQ tablet Take 10 mEq by mouth 2 (two)  times daily.    [provider]     Family History  Problem Relation Age of Onset  . Hypertension Mother   . Hypertension Father   . Heart disease Father   . Hypertension Maternal Grandmother   . Breast cancer Paternal Aunt   . Breast cancer Maternal Aunt   . Colon cancer Neg Hx     Social History   Socioeconomic History  . Marital status: Married    Spouse name:  Not on file  . Number of children: Not on file  . Years of education: Not on file  . Highest education level: Not on file  Social Needs  . Financial resource strain: Not on file  . Food insecurity - worry: Not on file  . Food insecurity - inability: Not on file  . Transportation needs - medical: Not on file  . Transportation needs - non-medical: Not on file  Occupational History  . Not on file  Tobacco Use  . Smoking status: Never Smoker  . Smokeless tobacco: Never Used  Substance and Sexual Activity  . Alcohol use: Yes    Comment: occassional   . Drug use: No  . Sexual activity: Yes    Birth control/protection: None  Other Topics Concern  . 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  All other systems are negative.  Review of Systems  Constitutional: Negative.   HENT: Negative.   Respiratory: Negative.   Cardiovascular: Negative.   Gastrointestinal: Negative.   Genitourinary: Positive for menstrual problem. Negative for difficulty urinating, dysuria, enuresis, flank pain, frequency and urgency.  Musculoskeletal: Positive for back pain. Negative for joint swelling and myalgias.  Skin: Negative.   Neurological: Negative.     Vital Signs: BP (!) 117/58   Pulse 91   Temp 98.6 F (37 C) (Oral)   Resp 17   Ht 5' (1.524 m)   Wt 254 lb (115.2 kg)   SpO2 98%   BMI 49.61 kg/m   Physical Exam  Constitutional: She is oriented to person, place, and time. She appears well-developed and well-nourished. No distress.  Morbidly obese.  HENT:  Head: Normocephalic and atraumatic.  Neck: Neck supple. No JVD present. No thyromegaly present.  Cardiovascular: Normal rate, regular rhythm, normal heart sounds and intact distal pulses. Exam reveals no gallop and no friction rub.  No murmur heard. Pulmonary/Chest: Effort normal and breath sounds  normal. No stridor. No respiratory distress. She has no wheezes. She has no rales.  Abdominal: Soft. Bowel sounds are normal. She exhibits no distension. There is no tenderness. There is no rebound and no guarding.  Difficult to palpate uterus on transabdominal exam, but likely enlarged based on deep palpation.  Musculoskeletal: She exhibits no edema.  Lymphadenopathy:    She has no cervical adenopathy.  Neurological: She is alert and oriented to person, place, and time.  Skin: Skin is warm and dry. She is not diaphoretic.  Vitals reviewed.  Imaging: Ir Radiologist Eval & Mgmt  Result Date: 04/09/2017 Please refer to notes tab for details about interventional procedure. (Op Note)   Labs:  CBC: No results for input(s): WBC, HGB, HCT, PLT in the last 8760 hours.  COAGS: No results for input(s): INR, APTT in the last 8760 hours.  BMP: Recent Labs    08/09/16 1015 03/21/17 1142  NA  138 138  K 4.8 3.9  CL 97 102  CO2 26 19*  GLUCOSE 98 90  BUN 11 11  CALCIUM 10.0 10.0  CREATININE 1.04* 0.86  GFRNONAA 63 79  GFRAA 73 91    LIVER FUNCTION TESTS: Recent Labs    08/09/16 1015 03/21/17 1142  BILITOT 0.5 0.7  AST 28 30  ALT 28 28  ALKPHOS 26* 23*  PROT 7.6 7.7  ALBUMIN 4.7 4.7    Assessment and Plan:  I met with Ms. Kagan and discussed treatment options for symptomatic uterine fibroids including fibroid embolization and hysterectomy. She has at least one large fibroid by ultrasound. She also has what appears to be a smaller degenerated fibroid that is calcified by ultrasound. As she is clearly currently perimenopausal and also has had some better control of bleeding symptoms with increase in dose of Aygestin, I told her that she may not have to have any procedure performed prior to menopause. Given some persistent irregular bleeding at age 41, I did recommend that we go ahead and pursue a pelvic MRI to better characterize fibroid morphology and tried to image the  endometrium better as the endometrium is very distorted on the ultrasound and not accurately measured.  Details of fibroid embolization were discussed should it be necessary in the future.  I will discuss MRI results with Ms. Wilford once the MRI is performed as an outpatient. Embolization may only be necessary should bleeding symptoms worsen despite maximizing hormonal therapy. If embolization is considered, I would recommend an endometrial biopsy to exclude endometrial pathology prior to pursuing fibroid embolization.  Thank you for this interesting consult.  I greatly enjoyed meeting Guelda Batson and look forward to participating in their care.  A copy of this report was sent to the requesting provider on this date.  Electronically SignedAletta Edouard T 04/09/2017, 11:02 AM     I spent a total of 40 Minutes in face to face in clinical consultation, greater than 50% of which was counseling/coordinating care for symptomatic uterine fibroids.

## 2017-04-15 ENCOUNTER — Ambulatory Visit (HOSPITAL_COMMUNITY)
Admission: RE | Admit: 2017-04-15 | Discharge: 2017-04-15 | Disposition: A | Discharge status: Home / Self Care | Payer: BC Managed Care – PPO | Source: Ambulatory Visit | Attending: Interventional Radiology | Admitting: Interventional Radiology

## 2017-04-15 ENCOUNTER — Institutional Professional Consult (permissible substitution): Payer: BC Managed Care – PPO | Admitting: Neurology

## 2017-04-15 DIAGNOSIS — D250 Submucous leiomyoma of uterus: Secondary | ICD-10-CM | POA: Diagnosis not present

## 2017-04-15 DIAGNOSIS — N852 Hypertrophy of uterus: Secondary | ICD-10-CM | POA: Insufficient documentation

## 2017-04-15 MED ORDER — GADOBENATE DIMEGLUMINE 529 MG/ML IV SOLN: 20.0000 mL | Freq: Once | INTRAVENOUS | Status: DC | PRN

## 2017-04-17 ENCOUNTER — Other Ambulatory Visit (HOSPITAL_COMMUNITY): Payer: Self-pay | Admitting: Interventional Radiology

## 2017-04-17 ENCOUNTER — Ambulatory Visit (HOSPITAL_COMMUNITY)
Admission: RE | Admit: 2017-04-17 | Discharge: 2017-04-17 | Disposition: A | Discharge status: Home / Self Care | Payer: BC Managed Care – PPO | Source: Ambulatory Visit | Attending: Interventional Radiology | Admitting: Interventional Radiology

## 2017-04-17 DIAGNOSIS — D250 Submucous leiomyoma of uterus: Secondary | ICD-10-CM | POA: Diagnosis present

## 2017-04-29 ENCOUNTER — Telehealth: Payer: Self-pay | Admitting: Physician Assistant

## 2017-04-29 NOTE — Telephone Encounter (Signed)
Result note from S. English, PA-C  read to patient. Pt does not want to start statin at this time. States she will modify her diet and exercise and have follow up in 3 months as noted. Result note was not routed to [facility].

## 2017-05-02 ENCOUNTER — Telehealth: Payer: Self-pay | Admitting: Radiology

## 2017-05-02 NOTE — Telephone Encounter (Signed)
Dr Kathlene Cote has reviewed MR of 04/15/2017.  Per Dr Kathlene Cote:  Patient is a candidate for [place].  Since her bleeding has not been severe she may want to wait and see how she does over the next several months.  If she should decide that she wants a [place], she will need to have an endometrial biopsy first.  Patient is in agreement, and will contact our office if needed.  Kamel [place] Riki Rusk, RN 05/02/2017 9:52 AM

## 2017-05-02 NOTE — Telephone Encounter (Signed)
Left msg on patient's M# requesting patient call back to review MR results of 04/15/2017 for pre [place] screening.  Joyia Riehle Riki Rusk, RN 05/02/2017 9:23 AM

## 2017-06-20 ENCOUNTER — Other Ambulatory Visit: Payer: Self-pay

## 2017-06-20 ENCOUNTER — Encounter: Payer: Self-pay | Admitting: Physician Assistant

## 2017-06-20 ENCOUNTER — Ambulatory Visit (INDEPENDENT_AMBULATORY_CARE_PROVIDER_SITE_OTHER): Payer: BC Managed Care – PPO | Admitting: Physician Assistant

## 2017-06-20 VITALS — BP 142/92 | HR 87 | Temp 98.6°F | Resp 16 | Ht 61.0 in | Wt 250.6 lb

## 2017-06-20 DIAGNOSIS — Z13220 Encounter for screening for lipoid disorders: Secondary | ICD-10-CM | POA: Diagnosis not present

## 2017-06-20 DIAGNOSIS — Z13228 Encounter for screening for other metabolic disorders: Secondary | ICD-10-CM

## 2017-06-20 DIAGNOSIS — Z1329 Encounter for screening for other suspected endocrine disorder: Secondary | ICD-10-CM | POA: Diagnosis not present

## 2017-06-20 DIAGNOSIS — Z113 Encounter for screening for infections with a predominantly sexual mode of transmission: Secondary | ICD-10-CM | POA: Diagnosis not present

## 2017-06-20 DIAGNOSIS — Z130 Encounter for screening for diseases of the blood and blood-forming organs and certain disorders involving the immune mechanism: Secondary | ICD-10-CM

## 2017-06-20 DIAGNOSIS — Z131 Encounter for screening for diabetes mellitus: Secondary | ICD-10-CM

## 2017-06-20 DIAGNOSIS — Z0000 Encounter for general adult medical examination without abnormal findings: Secondary | ICD-10-CM

## 2017-06-20 NOTE — Progress Notes (Signed)
PRIMARY CARE AT [facility], [place] [address] [phone]  Date:  06/20/2017   Name:  Debra Mendoza   DOB:  11-Sep-1965   MRN:  [id number]  PCP:  Debra Bachelor, PA    History of Present Illness:  Debra Mendoza is a 52 y.o. female patient who presents to PCP with  Chief Complaint  Patient presents with  . Annual Exam     DIET: she is watching what she is.  She is cutting back on the chips that she reported 3 months ago.  She is eating oatmeal in the morning.  She is cutting back on her sodas.    BM: no black or bloody stool.  No constipation or diarrhea  URINATION: normal.  No dysuria, frequency, or hematuria.    SLEEP: getting better, and sleeping through the night.    SOCIAL ACTIVITY:  She is going through menopause She enjoys gambling at the [facility].   EtOH: none Tobacco or vaping: none illicit drug use: none  8/18--mammogram normal.   8/14--colonoscopy normal.    Patient Active Problem List   Diagnosis Date Noted  . Moderate asthma 03/08/2016  . Essential hypertension, benign 03/08/2016  . Numbness and tingling of left leg 11/04/2014  . HTN (hypertension) 07/09/2011  . Obesity 07/09/2011    Past Medical History:  Diagnosis Date  . Hypertension     Past Surgical History:  Procedure Laterality Date  . IR RADIOLOGIST EVAL & MGMT  04/09/2017  . MYOMECTOMY    . NOVASURE ABLATION      Social History   Tobacco Use  . Smoking status: Never Smoker  . Smokeless tobacco: Never Used  Substance Use Topics  . Alcohol use: Yes    Comment: occassional   . Drug use: No    Family History  Problem Relation Age of Onset  . Hypertension Mother   . Hypertension Father   . Heart disease Father   . Hypertension Maternal Grandmother   . Breast cancer Paternal Aunt   . Breast cancer Maternal Aunt   . Colon cancer Neg Hx     No Known Allergies  Medication list has been reviewed and updated.  Current Outpatient Medications on  File Prior to Visit  Medication Sig Dispense Refill  . amLODipine (NORVASC) 5 MG tablet TAKE 1 TABLET BY MOUTH ONCE DAILY 90 tablet 3  . Ascorbic Acid (VITAMIN C) 100 MG tablet Take 1,000 mg by mouth daily.     . Biotin 5000 MCG CAPS Take by mouth.    . budesonide-formoterol (SYMBICORT) 80-4.5 MCG/ACT inhaler Inhale 2 puffs into the lungs 2 (two) times daily. 1 Inhaler 5  . Calcium Carbonate-Vitamin D (CALCIUM 600+D PO) Take 1 tablet by mouth daily.    . cetirizine (ZYRTEC) 10 MG tablet Take 1 tablet (10 mg total) by mouth daily. 30 tablet 5  . losartan-hydrochlorothiazide (HYZAAR) 100-12.5 MG tablet Take 1 tablet by mouth daily. 90 tablet 1  . norethindrone (AYGESTIN) 5 MG tablet Take 5 mg by mouth daily.    . potassium chloride (K-DUR,KLOR-CON) 10 MEQ tablet Take 10 mEq by mouth 2 (two) times daily.    Marland Kitchen albuterol (PROVENTIL HFA;VENTOLIN HFA) 108 (90 Base) MCG/ACT inhaler Inhale 2 puffs into the lungs 2 (two) times daily as needed for wheezing or shortness of breath (cough, shortness of breath or wheezing.). (Patient not taking: Reported on 06/20/2017) 1 Inhaler 1  . Black Cohosh 40 MG CAPS Take 40 mg by mouth.    Marland Kitchen  cyclobenzaprine (FLEXERIL) 10 MG tablet Take 0.5-1 tablets (5-10 mg total) by mouth 3 (three) times daily as needed. (Patient not taking: Reported on 06/20/2017) 30 tablet 0  . Ipratropium-Albuterol (COMBIVENT) 20-100 MCG/ACT AERS respimat Inhale 1 puff into the lungs every 6 (six) hours. (Patient not taking: Reported on 06/20/2017) 4 g 2   No current facility-administered medications on file prior to visit.     Review of Systems  Constitutional: Negative for chills and fever.  HENT: Negative for ear discharge, ear pain and sore throat.   Eyes: Negative for blurred vision and double vision.  Respiratory: Negative for cough, shortness of breath and wheezing.   Cardiovascular: Negative for chest pain, palpitations and leg swelling.  Gastrointestinal: Negative for diarrhea, nausea  and vomiting.  Genitourinary: Negative for dysuria, frequency and hematuria.  Skin: Negative for itching and rash.  Neurological: Negative for dizziness and headaches.   ROS otherwise unremarkable unless listed above.  Physical Examination: BP (!) 142/92 (BP Location: Right Arm)   Pulse 87   Temp 98.6 F (37 C) (Oral)   Resp 16   Ht _0  (1.549 m)   Wt 250 lb 9.6 oz (113.7 kg)   SpO2 97%   BMI 47.35 kg/m  Ideal Body Weight: Weight in (lb) to have BMI = 25: 132  Physical Exam  Constitutional: She is oriented to person, place, and time. She appears well-developed and well-nourished. No distress.  HENT:  Head: Normocephalic and atraumatic.  Right Ear: External ear normal.  Left Ear: External ear normal.  Eyes: Pupils are equal, round, and reactive to light. Conjunctivae and EOM are normal.  Cardiovascular: Normal rate.  Pulmonary/Chest: Effort normal. No respiratory distress.  Neurological: She is alert and oriented to person, place, and time.  Skin: She is not diaphoretic.  Psychiatric: She has a normal mood and affect. Her behavior is normal.     Assessment and Plan: [name] is a 52 y.o. female who is here today for cc of  Chief Complaint  Patient presents with  . Annual Exam   Annual physical exam - Plan: CBC, CMP14+EGFR, TSH, Lipid panel, Hemoglobin A1c, HIV antibody, RPR  Morbid obesity (HCC), Chronic  Screening for metabolic disorder - Plan: CMP14+EGFR  Screening for deficiency anemia - Plan: CBC  Screening for thyroid disorder - Plan: TSH  Screening for lipid disorders - Plan: Lipid panel  Screening for diabetes mellitus - Plan: Hemoglobin A1c  Screening for STD (sexually transmitted disease) - Plan: HIV antibody, RPR  [name], PA-C [facility] 3/22/201910:59 AM

## 2017-06-20 NOTE — Patient Instructions (Addendum)
Keeping You Healthy  Get These Tests  Blood Pressure- Have your blood pressure checked by your healthcare provider at least once a year.  Normal blood pressure is 120/80.  Weight- Have your body mass index (BMI) calculated to screen for obesity.  BMI is a measure of body fat based on height and weight.  You can calculate your own BMI at www.nhlbisupport.com/bmi/  Cholesterol- Have your cholesterol checked every year.  Diabetes- Have your blood sugar checked every year if you have high blood pressure, high cholesterol, a family history of diabetes or if you are overweight.  Pap Test - Have a pap test every 1 to 5 years if you have been sexually active.  If you are older than 65 and recent pap tests have been normal you may not need additional pap tests.  In addition, if you have had a hysterectomy  for benign disease additional pap tests are not necessary.  Mammogram-Yearly mammograms are essential for early detection of breast cancer  Screening for Colon Cancer- Colonoscopy starting at age 50. Screening may begin sooner depending on your family history and other health conditions.  Follow up colonoscopy as directed by your Gastroenterologist.  Screening for Osteoporosis- Screening begins at age 65 with bone density scanning, sooner if you are at higher risk for developing Osteoporosis.  Get these medicines  Calcium with Vitamin D- Your body requires 1200-1500 mg of Calcium a day and 800-1000 IU of Vitamin D a day.  You can only absorb 500 mg of Calcium at a time therefore Calcium must be taken in 2 or 3 separate doses throughout the day.  Hormones- Hormone therapy has been associated with increased risk for certain cancers and heart disease.  Talk to your healthcare provider about if you need relief from menopausal symptoms.  Aspirin- Ask your healthcare provider about taking Aspirin to prevent Heart Disease and Stroke.  Get these Immuniztions  Flu shot- Every fall  Pneumonia shot-  Once after the age of 65; if you are younger ask your healthcare provider if you need a pneumonia shot.  Tetanus- Every ten years.  Zostavax- Once after the age of 60 to prevent shingles.  Take these steps  Don't smoke- Your healthcare provider can help you quit. For tips on how to quit, ask your healthcare provider or go to www.smokefree.gov or call 1-800 QUIT-NOW.  Be physically active- Exercise 5 days a week for a minimum of 30 minutes.  If you are not already physically active, start slow and gradually work up to 30 minutes of moderate physical activity.  Try walking, dancing, bike riding, swimming, etc.  Eat a healthy diet- Eat a variety of healthy foods such as fruits, vegetables, whole grains, low fat milk, low fat cheeses, yogurt, lean meats, chicken, fish, eggs, dried beans, tofu, etc.  For more information go to www.thenutritionsource.org  Dental visit- Brush and floss teeth twice daily; visit your dentist twice a year.  Eye exam- Visit your Optometrist or Ophthalmologist yearly.  Drink alcohol in moderation- Limit alcohol intake to one drink or less a day.  Never drink and drive.  Depression- Your emotional health is as important as your physical health.  If you're feeling down or losing interest in things you normally enjoy, please talk to your healthcare provider.  Seat Belts- can save your life; always wear one  Smoke/Carbon Monoxide detectors- These detectors need to be installed on the appropriate level of your home.  Replace batteries at least once a year.  Violence- If   anyone is threatening or hurting you, please tell your healthcare provider.  Living Will/ Health care power of attorney- Discuss with your healthcare provider and family.    IF you received an x-ray today, you will receive an invoice from [facility]. Please contact [facility] at [phone] with questions or concerns regarding your invoice.   IF you received labwork today, you  will receive an invoice from [facility]. Please contact [facility] at [phone] with questions or concerns regarding your invoice.   Our billing staff will not be able to assist you with questions regarding bills from these companies.  You will be contacted with the lab results as soon as they are available. The fastest way to get your results is to activate your My Chart account. Instructions are located on the last page of this paperwork. If you have not heard from [place] regarding the results in 2 weeks, please contact this office.

## 2017-06-21 LAB — CMP14+EGFR
A/G RATIO: 1.7 (ref 1.2–2.2)
ALBUMIN: 4.9 g/dL (ref 3.5–5.5)
ALT: 27 IU/L (ref 0–32)
AST: 25 IU/L (ref 0–40)
Alkaline Phosphatase: 24 IU/L — ABNORMAL LOW (ref 39–117)
BUN / CREAT RATIO: 11 (ref 9–23)
BUN: 11 mg/dL (ref 6–24)
Bilirubin Total: 1 mg/dL (ref 0.0–1.2)
CALCIUM: 10.4 mg/dL — AB (ref 8.7–10.2)
CO2: 23 mmol/L (ref 20–29)
CREATININE: 1.03 mg/dL — AB (ref 0.57–1.00)
Chloride: 99 mmol/L (ref 96–106)
GFR calc Af Amer: 73 mL/min/{1.73_m2} (ref >59)
GFR, EST NON AFRICAN AMERICAN: 64 mL/min/{1.73_m2} (ref >59)
GLOBULIN, TOTAL: 2.9 g/dL (ref 1.5–4.5)
Glucose: 94 mg/dL (ref 65–99)
POTASSIUM: 4.1 mmol/L (ref 3.5–5.2)
SODIUM: 141 mmol/L (ref 134–144)
TOTAL PROTEIN: 7.8 g/dL (ref 6.0–8.5)

## 2017-06-21 LAB — CBC
HEMATOCRIT: 47.8 % — AB (ref 34.0–46.6)
Hemoglobin: 15.7 g/dL (ref 11.1–15.9)
MCH: 28.1 pg (ref 26.6–33.0)
MCHC: 32.8 g/dL (ref 31.5–35.7)
MCV: 86 fL (ref 79–97)
Platelets: 276 10*3/uL (ref 150–379)
RBC: 5.59 x10E6/uL — AB (ref 3.77–5.28)
RDW: 15.5 % — AB (ref 12.3–15.4)
WBC: 5.7 10*3/uL (ref 3.4–10.8)

## 2017-06-21 LAB — HIV ANTIBODY (ROUTINE TESTING W REFLEX): HIV SCREEN 4TH GENERATION: NONREACTIVE

## 2017-06-21 LAB — RPR: RPR Ser Ql: NONREACTIVE

## 2017-06-21 LAB — HEMOGLOBIN A1C
ESTIMATED AVERAGE GLUCOSE: 137 mg/dL
Hgb A1c MFr Bld: 6.4 % — ABNORMAL HIGH (ref 4.8–5.6)

## 2017-06-21 LAB — LIPID PANEL
CHOLESTEROL TOTAL: 173 mg/dL (ref 100–199)
Chol/HDL Ratio: 6.9 ratio — ABNORMAL HIGH (ref 0.0–4.4)
HDL: 25 mg/dL — ABNORMAL LOW (ref >39)
LDL CALC: 131 mg/dL — AB (ref 0–99)
Triglycerides: 85 mg/dL (ref 0–149)
VLDL CHOLESTEROL CAL: 17 mg/dL (ref 5–40)

## 2017-06-21 LAB — TSH: TSH: 2.5 u[IU]/mL (ref 0.450–4.500)

## 2017-07-03 ENCOUNTER — Encounter: Payer: Self-pay | Admitting: Physician Assistant

## 2017-07-25 ENCOUNTER — Other Ambulatory Visit: Payer: Self-pay | Admitting: Physician Assistant

## 2017-07-25 ENCOUNTER — Telehealth: Payer: Self-pay | Admitting: Physician Assistant

## 2017-07-25 DIAGNOSIS — E786 Lipoprotein deficiency: Secondary | ICD-10-CM

## 2017-07-25 DIAGNOSIS — E7800 Pure hypercholesterolemia, unspecified: Secondary | ICD-10-CM

## 2017-07-25 MED ORDER — FENOFIBRATE 48 MG PO TABS: 48.0000 mg | ORAL_TABLET | Freq: Every day | ORAL | 2 refills | Status: DC

## 2017-07-25 NOTE — Telephone Encounter (Signed)
Pt given results per notes of [facility] on 07/25/17, patient verbalized understanding. Unable to document in result note due to result note not being routed to [facility].

## 2017-07-30 ENCOUNTER — Other Ambulatory Visit: Payer: Self-pay | Admitting: Physician Assistant

## 2017-07-30 DIAGNOSIS — J45909 Unspecified asthma, uncomplicated: Secondary | ICD-10-CM

## 2017-10-07 ENCOUNTER — Other Ambulatory Visit: Payer: Self-pay

## 2017-10-07 ENCOUNTER — Ambulatory Visit: Payer: BC Managed Care – PPO | Admitting: Physician Assistant

## 2017-10-07 ENCOUNTER — Encounter: Payer: Self-pay | Admitting: Physician Assistant

## 2017-10-07 VITALS — BP 164/91 | HR 100 | Temp 98.6°F | Resp 16 | Ht 59.0 in | Wt 248.2 lb

## 2017-10-07 DIAGNOSIS — I10 Essential (primary) hypertension: Secondary | ICD-10-CM

## 2017-10-07 DIAGNOSIS — E785 Hyperlipidemia, unspecified: Secondary | ICD-10-CM

## 2017-10-07 LAB — LIPID PANEL
Chol/HDL Ratio: 5.7 ratio — ABNORMAL HIGH (ref 0.0–4.4)
Cholesterol, Total: 142 mg/dL (ref 100–199)
HDL: 25 mg/dL — ABNORMAL LOW (ref >39)
LDL Calculated: 102 mg/dL — ABNORMAL HIGH (ref 0–99)
Triglycerides: 77 mg/dL (ref 0–149)
VLDL Cholesterol Cal: 15 mg/dL (ref 5–40)

## 2017-10-07 MED ORDER — LOSARTAN POTASSIUM-HCTZ 100-12.5 MG PO TABS: 1.0000 | ORAL_TABLET | Freq: Every day | ORAL | 1 refills | Status: DC

## 2017-10-07 MED ORDER — FENOFIBRATE 48 MG PO TABS: 48.0000 mg | ORAL_TABLET | Freq: Every day | ORAL | 3 refills | Status: DC

## 2017-10-07 NOTE — Patient Instructions (Addendum)
We will be in contact with you about your lab results and plan for follow-up. Try to consume a diet that emphasizes intake of vegetables, fruits, nuts, and whole grains; including low-fat dairy, poultry, fish (like salmon and tuna), beans, olive oil, and nuts. Limit the amount of sweet, sugar, and red meat in your diet. We will monitor this value at your next visit.   Food Choices to Lower Your Triglycerides Triglycerides are a type of fat in your blood. High levels of triglycerides can increase the risk of heart disease and stroke. If your triglyceride levels are high, the foods you eat and your eating habits are very important. Choosing the right foods can help lower your triglycerides. What general guidelines do I need to follow?  Lose weight if you are overweight.  Limit or avoid alcohol.  Fill one half of your plate with vegetables and green salads.  Limit fruit to two servings a day. Choose fruit instead of juice.  Make one fourth of your plate whole grains. Look for the word "whole" as the first word in the ingredient list.  Fill one fourth of your plate with lean protein foods.  Enjoy fatty fish (such as salmon, mackerel, sardines, and tuna) three times a week.  Choose healthy fats.  Limit foods high in starch and sugar.  Eat more home-cooked food and less restaurant, buffet, and fast food.  Limit fried foods.  Cook foods using methods other than frying.  Limit saturated fats.  Check ingredient lists to avoid foods with partially hydrogenated oils (trans fats) in them. What foods can I eat? Grains Whole grains, such as whole wheat or whole grain breads, crackers, cereals, and pasta. Unsweetened oatmeal, bulgur, barley, quinoa, or brown rice. Corn or whole wheat flour tortillas. Vegetables Fresh or frozen vegetables (raw, steamed, roasted, or grilled). Green salads. Fruits All fresh, canned (in natural juice), or frozen fruits. Meat and Other Protein Products Ground  beef (85% or leaner), grass-fed beef, or beef trimmed of fat. Skinless chicken or [place]. Ground chicken or [place]. Pork trimmed of fat. All fish and seafood. Eggs. Dried beans, peas, or lentils. Unsalted nuts or seeds. Unsalted canned or dry beans. Dairy Low-fat dairy products, such as skim or 1% milk, 2% or reduced-fat cheeses, low-fat ricotta or cottage cheese, or plain low-fat yogurt. Fats and Oils Tub margarines without trans fats. Light or reduced-fat mayonnaise and salad dressings. Avocado. Safflower, olive, or canola oils. Natural peanut or almond butter. The items listed above may not be a complete list of recommended foods or beverages. Contact your dietitian for more options. What foods are not recommended? Grains White bread. White pasta. White rice. Cornbread. Bagels, pastries, and croissants. Crackers that contain trans fat. Vegetables White potatoes. Corn. Creamed or fried vegetables. Vegetables in a cheese sauce. Fruits Dried fruits. Canned fruit in light or heavy syrup. Fruit juice. Meat and Other Protein Products Fatty cuts of meat. Ribs, chicken wings, bacon, sausage, bologna, salami, chitterlings, fatback, hot dogs, bratwurst, and packaged luncheon meats. Dairy Whole or 2% milk, cream, half-and-half, and cream cheese. Whole-fat or sweetened yogurt. Full-fat cheeses. Nondairy creamers and whipped toppings. Processed cheese, cheese spreads, or cheese curds. Sweets and Desserts Corn syrup, sugars, honey, and molasses. Candy. Jam and jelly. Syrup. Sweetened cereals. Cookies, pies, cakes, donuts, muffins, and ice cream. Fats and Oils Butter, stick margarine, lard, shortening, ghee, or bacon fat. Coconut, palm kernel, or palm oils. Beverages Alcohol. Sweetened drinks (such as sodas, lemonade, and fruit drinks or punches). The  items listed above may not be a complete list of foods and beverages to avoid. Contact your dietitian for more information. This information is not  intended to replace advice given to you by your health care provider. Make sure you discuss any questions you have with your health care provider. Document Released: 01/05/2004 Document Revised: 08/25/2015 Document Reviewed: 01/21/2013 Elsevier Interactive Patient Education  2017 [place] you for coming in today. I hope you feel we met your needs.  Feel free to call PCP if you have any questions or further requests.  Please consider signing up for MyChart if you do not already have it, as this is a great way to communicate with me.  Best,  Whitney McVey, PA-C  IF you received an x-ray today, you will receive an invoice from [facility]. Please contact [facility] at [phone] with questions or concerns regarding your invoice.   IF you received labwork today, you will receive an invoice from [facility]. Please contact [facility] at [phone] with questions or concerns regarding your invoice.   Our billing staff will not be able to assist you with questions regarding bills from these companies.  You will be contacted with the lab results as soon as they are available. The fastest way to get your results is to activate your My Chart account. Instructions are located on the last page of this paperwork. If you have not heard from [place] regarding the results in 2 weeks, please contact this office.

## 2017-10-07 NOTE — Progress Notes (Signed)
Debra Mendoza  MRN: [id number] DOB: Jun 23, 1965  PCP: [name] D, PA  Subjective:  Pt is a 52 year old female who presents to clinic for medication refill.   HTN - Losartan Potassium-HCTZ 100-12.5 mg and norvasc 5mg .  She is out of Losartan Potassium-HCTZ 100-12.5 mg. Today's blood pressure is 150/106. Home blood pressures are 140-148/77-89.  Patient is out of Hyzaar  HLD - started Tricor 3 months ago after annual exam revealed elevated triglycerides - pt was asked to RTC in 2 months to recheck lipids. She is out and would like refill. Denies medication SE. She is cutting back on breads, sweet drinks. Not exercising.  Lab Results  Component Value Date   CHOL 173 06/20/2017   HDL 25 (L) 06/20/2017   LDLCALC 131 (H) 06/20/2017   TRIG 85 06/20/2017   CHOLHDL 6.9 (H) 06/20/2017    Review of Systems  Constitutional: Negative for chills, diaphoresis, fatigue and fever.  Respiratory: Negative for cough, chest tightness, shortness of breath and wheezing.   Cardiovascular: Negative for chest pain and palpitations.  Gastrointestinal: Negative for abdominal pain, diarrhea, nausea and vomiting.  Neurological: Negative for weakness, light-headedness and headaches.    Patient Active Problem List   Diagnosis Date Noted  . Morbid obesity ([place]) 06/20/2017  . Moderate asthma 03/08/2016  . Essential hypertension, benign 03/08/2016  . Numbness and tingling of left leg 11/04/2014  . HTN (hypertension) 07/09/2011  . Obesity 07/09/2011    Current Outpatient Medications on File Prior to Visit  Medication Sig Dispense Refill  . amLODipine (NORVASC) 5 MG tablet TAKE 1 TABLET BY MOUTH ONCE DAILY 90 tablet 3  . Ascorbic Acid (VITAMIN C) 100 MG tablet Take 1,000 mg by mouth daily.     . Biotin 5000 MCG CAPS Take by mouth.    . Black Cohosh 40 MG CAPS Take 40 mg by mouth.    . Calcium Carbonate-Vitamin D (CALCIUM 600+D PO) Take 1 tablet by mouth daily.    . cetirizine (ZYRTEC) 10 MG  tablet Take 1 tablet (10 mg total) by mouth daily. 30 tablet 5  . cyclobenzaprine (FLEXERIL) 10 MG tablet Take 0.5-1 tablets (5-10 mg total) by mouth 3 (three) times daily as needed. 30 tablet 0  . fenofibrate (TRICOR) 48 MG tablet Take 1 tablet (48 mg total) by mouth daily. 30 tablet 2  . losartan-hydrochlorothiazide (HYZAAR) 100-12.5 MG tablet Take 1 tablet by mouth daily. 90 tablet 1  . norethindrone (AYGESTIN) 5 MG tablet Take 5 mg by mouth daily.    . potassium chloride (K-DUR,KLOR-CON) 10 MEQ tablet Take 10 mEq by mouth 2 (two) times daily.    . SYMBICORT 80-4.5 MCG/ACT inhaler INHALE 2 PUFFS INTO THE LUNGS TWICE DAILY 1 Inhaler 2  . albuterol (PROVENTIL HFA;VENTOLIN HFA) 108 (90 Base) MCG/ACT inhaler Inhale 2 puffs into the lungs 2 (two) times daily as needed for wheezing or shortness of breath (cough, shortness of breath or wheezing.). (Patient not taking: Reported on 06/20/2017) 1 Inhaler 1  . Ipratropium-Albuterol (COMBIVENT) 20-100 MCG/ACT AERS respimat Inhale 1 puff into the lungs every 6 (six) hours. (Patient not taking: Reported on 06/20/2017) 4 g 2   No current facility-administered medications on file prior to visit.     No Known Allergies   Objective:  BP (!) 150/106 (BP Location: Left Arm, Patient Position: Sitting, Cuff Size: Large)   Pulse 100   Temp 98.6 F (37 C) (Oral)   Resp 16   Ht 4\' 11"  (1.499  m)   Wt 248 lb 3.2 oz (112.6 kg)   SpO2 96%   BMI 50.13 kg/m   Physical Exam  Constitutional: She is oriented to person, place, and time. No distress.  Cardiovascular: Normal rate, regular rhythm and normal heart sounds.  Musculoskeletal:       Right ankle: She exhibits no swelling.       Left ankle: She exhibits no swelling.       Right lower leg: She exhibits no edema.       Left lower leg: She exhibits no edema.  Neurological: She is alert and oriented to person, place, and time.  Skin: Skin is warm and dry.  Psychiatric: Judgment normal.  Vitals  reviewed.   Assessment and Plan :  1. Hyperlipidemia, unspecified hyperlipidemia type - started Tricor 3 months ago after annual exam revealed elevated triglycerides - pt was asked to RTC in 2 months to recheck lipids but did not return. She is out and would like refill. Denies medication SE. she has made small dietary changes, is not exercising.  Discussed importance of lifestyle changes to lower lipids.  She understands.  We will contact with results and plan of today's labs. - Lipid panel - fenofibrate (TRICOR) 48 MG tablet; Take 1 tablet (48 mg total) by mouth daily.  Dispense: 30 tablet; Refill: 3 2. Essential hypertension, benign - Patient is out of Hyzaar. today's blood pressure is 150/106. Home blood pressures are 140-148/77-89. - losartan-hydrochlorothiazide (HYZAAR) 100-12.5 MG tablet; Take 1 tablet by mouth daily.  Dispense: 90 tablet; Refill: 1   Whitney Camesha Farooq, PA-C  [facility] 10/07/2017 10:16 AM

## 2017-10-10 ENCOUNTER — Encounter: Payer: Self-pay | Admitting: Physician Assistant

## 2017-10-10 NOTE — Progress Notes (Signed)
Result letter mailed. Con't improving lifestyle.

## 2017-10-24 ENCOUNTER — Encounter: Payer: Self-pay | Admitting: Physician Assistant

## 2017-11-08 LAB — HM MAMMOGRAPHY: HM Mammogram: NORMAL (ref 0–4)

## 2017-11-12 ENCOUNTER — Ambulatory Visit: Payer: BC Managed Care – PPO | Admitting: Physician Assistant

## 2017-11-12 ENCOUNTER — Encounter: Payer: Self-pay | Admitting: Physician Assistant

## 2017-11-12 ENCOUNTER — Other Ambulatory Visit: Payer: Self-pay

## 2017-11-12 VITALS — BP 178/110 | HR 98 | Temp 98.8°F | Resp 16 | Ht 59.0 in | Wt 249.6 lb

## 2017-11-12 DIAGNOSIS — I10 Essential (primary) hypertension: Secondary | ICD-10-CM | POA: Diagnosis not present

## 2017-11-12 DIAGNOSIS — R030 Elevated blood-pressure reading, without diagnosis of hypertension: Secondary | ICD-10-CM | POA: Diagnosis not present

## 2017-11-12 MED ORDER — AMLODIPINE BESYLATE 10 MG PO TABS: ORAL_TABLET | ORAL | 2 refills | Status: DC

## 2017-11-12 NOTE — Progress Notes (Signed)
Debra Mendoza  MRN: [id number] DOB: September 01, 1965  PCP: Dorise Hiss, PA-C  Subjective:  Pt is a 52 year old female who presents to clinic for f/u HTN. She is concerned today bc she has had several home elevated BP readings.  HTN - Losartan Potassium-HCTZ 100-12.5mg  and norvasc 5mg .  Today's blood pressure is 178/110. Home blood pressures are high as well.  she is taking Hyzaar and norvasc daily, has not missed a dose. She is asymptomatic today.  Denies chest pain, shortness of breath, dyspnea on exertion,, headache, lightheadedness. Recent life stressors: family issues, husband is going to be out of a job in a few months.   She just refilled all BP medications.   She is walking a few days/week.  Not sleeping well due to hot flashes.  Drinks "every now and then" - about once/month. Cooks without salt. Does not eat out much. Tries to eat healthy.  Her husband has told her in the past that she snores, no known episodes of apnea, no am HA, no severe daytime fatigue.   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 Needs blood pressure under control.   Review of Systems  Constitutional: Negative for chills, diaphoresis, fatigue and fever.  Respiratory: Negative for cough and shortness of breath.   Cardiovascular: Negative for chest pain and palpitations.  Gastrointestinal: Negative for nausea and vomiting.  Neurological: Negative for dizziness, light-headedness and headaches.    Patient Active Problem List   Diagnosis Date Noted  . Morbid obesity ([place]) 06/20/2017  . Moderate asthma 03/08/2016  . Essential hypertension, benign 03/08/2016  . Numbness and tingling of left leg 11/04/2014  . HTN (hypertension) 07/09/2011  . Obesity 07/09/2011    Current Outpatient Medications on File Prior to Visit  Medication Sig Dispense Refill  . amLODipine (NORVASC) 5 MG tablet TAKE 1 TABLET BY MOUTH ONCE DAILY 90 tablet 3  . Ascorbic Acid (VITAMIN C) 100 MG tablet Take  1,000 mg by mouth daily.     . Biotin 5000 MCG CAPS Take by mouth.    . Black Cohosh 40 MG CAPS Take 40 mg by mouth.    . Calcium Carbonate-Vitamin D (CALCIUM 600+D PO) Take 1 tablet by mouth daily.    . cetirizine (ZYRTEC) 10 MG tablet Take 1 tablet (10 mg total) by mouth daily. 30 tablet 5  . losartan-hydrochlorothiazide (HYZAAR) 100-12.5 MG tablet Take 1 tablet by mouth daily. 90 tablet 1  . norethindrone (AYGESTIN) 5 MG tablet Take 5 mg by mouth daily.    . potassium chloride (K-DUR,KLOR-CON) 10 MEQ tablet Take 10 mEq by mouth 2 (two) times daily.    . SYMBICORT 80-4.5 MCG/ACT inhaler INHALE 2 PUFFS INTO THE LUNGS TWICE DAILY 1 Inhaler 2  . cyclobenzaprine (FLEXERIL) 10 MG tablet Take 0.5-1 tablets (5-10 mg total) by mouth 3 (three) times daily as needed. (Patient not taking: Reported on 11/12/2017) 30 tablet 0  . fenofibrate (TRICOR) 48 MG tablet Take 1 tablet (48 mg total) by mouth daily. (Patient not taking: Reported on 11/12/2017) 30 tablet 3   No current facility-administered medications on file prior to visit.     No Known Allergies   Objective:  BP (!) 178/110   Pulse 98   Temp 98.8 F (37.1 C)   Resp 16   Ht 4\' 11"  (1.499 m)   Wt 249 lb 9.6 oz (113.2 kg)   SpO2 98%   BMI 50.41 kg/m   Physical Exam  Constitutional:  She is oriented to person, place, and time. No distress.  Cardiovascular: Normal rate, regular rhythm and normal heart sounds.  Neurological: She is alert and oriented to person, place, and time.  Skin: Skin is warm and dry.  Psychiatric: Judgment normal.  Vitals reviewed.   Assessment and Plan :  1. Elevated blood pressure reading - Patient presents with concerns about elevated home blood pressure readings. Today's blood pressure is 178/110.  She endorses medication compliance with losartan Potassium-HCTZ 100-12.5mg  and norvasc 5mg .  She is asymptomatic today.  She does endorse increased life stressors.  She admits to snoring however does not believe it is  severe, discussed with patient that she should talk with her husband and ask him to evaluate snoring.  Will increase Norvasc to 10 mg.  Return to clinic in 2 to 3 weeks for blood pressure recheck.   2. Essential hypertension - amLODipine (NORVASC) 10 MG tablet; TAKE 1 TABLET BY MOUTH ONCE DAILY  Dispense: 90 tablet; Refill: 2   Mercer Pod, PA-C  [facility] [place] 11/12/2017 8:18 AM  Please note: Portions of this report may have been transcribed using dragon voice recognition software. Every effort was made to ensure accuracy; however, inadvertent computerized transcription errors may be present.

## 2017-11-12 NOTE — Patient Instructions (Addendum)
Ask your husband about your snoring - we may get a sleep study. Come back and see me for recheck BP in 3 week.s.   Increase: Amlodipine to '10mg'$  (start taking two of your '5mg'$  pills).    ACTIVITY/FITNESS Keep moving your body to the best of your abilities. You can do this at home, inside or outside, the park, community center, gym or anywhere you like. Consider a physical therapist or personal trainer to get started. Consider the app Sworkit. Fitness trackers such as smart-watches, smart-phones or Fitbits can help as well.  NUTRITION Eat more plants: colorful vegetables, nuts, seeds and berries. Eat less sugar, salt, preservatives and processed foods. Avoid toxins such as cigarettes and alcohol. Drink water when you are thirsty. Warm water with a slice of lemon is an excellent morning drink to start the day.  Consider these websites for more information The Nutrition Source (https://www.henry-hernandez.biz/) Precision Nutrition (WindowBlog.ch)  RELAXATION Consider practicing mindfulness meditation or other relaxation techniques such as deep breathing, prayer, yoga, tai chi, massage. See website mindful.org or the apps Headspace or Calm to help get started.  SLEEP Try to get at least 7-8+ hours sleep per day. Regular exercise and reduced caffeine will help you sleep better. Practice good sleep hygeine techniques. See website sleep.org for more information.   DASH Eating Plan DASH stands for "Dietary Approaches to Stop Hypertension." The DASH eating plan is a healthy eating plan that has been shown to reduce high blood pressure (hypertension). It may also reduce your risk for type 2 diabetes, heart disease, and stroke. The DASH eating plan may also help with weight loss. What are tips for following this plan? General guidelines  Avoid eating more than 2,300 mg (milligrams) of salt (sodium) a day. If you have hypertension, you may need to  reduce your sodium intake to 1,500 mg a day.  Limit alcohol intake to no more than 1 drink a day for nonpregnant women and 2 drinks a day for men. One drink equals 12 oz of beer, 5 oz of wine, or 1 oz of hard liquor.  Work with your health care provider to maintain a healthy body weight or to lose weight. Ask what an ideal weight is for you.  Get at least 30 minutes of exercise that causes your heart to beat faster (aerobic exercise) most days of the week. Activities may include walking, swimming, or biking.  Work with your health care provider or diet and nutrition specialist (dietitian) to adjust your eating plan to your individual calorie needs. Reading food labels  Check food labels for the amount of sodium per serving. Choose foods with less than 5 percent of the Daily Value of sodium. Generally, foods with less than 300 mg of sodium per serving fit into this eating plan.  To find whole grains, look for the word "whole" as the first word in the ingredient list. Shopping  Buy products labeled as "low-sodium" or "no salt added."  Buy fresh foods. Avoid canned foods and premade or frozen meals. Cooking  Avoid adding salt when cooking. Use salt-free seasonings or herbs instead of table salt or sea salt. Check with your health care provider or pharmacist before using salt substitutes.  Do not fry foods. Cook foods using healthy methods such as baking, boiling, grilling, and broiling instead.  Cook with heart-healthy oils, such as olive, canola, soybean, or sunflower oil. Meal planning   Eat a balanced diet that includes: ? 5 or more servings of fruits and vegetables each  day. At each meal, try to fill half of your plate with fruits and vegetables. ? Up to 6-8 servings of whole grains each day. ? Less than 6 oz of lean meat, poultry, or fish each day. A 3-oz serving of meat is about the same size as a deck of cards. One egg equals 1 oz. ? 2 servings of low-fat dairy each day. ? A  serving of nuts, seeds, or beans 5 times each week. ? Heart-healthy fats. Healthy fats called Omega-3 fatty acids are found in foods such as flaxseeds and coldwater fish, like sardines, salmon, and mackerel.  Limit how much you eat of the following: ? Canned or prepackaged foods. ? Food that is high in trans fat, such as fried foods. ? Food that is high in saturated fat, such as fatty meat. ? Sweets, desserts, sugary drinks, and other foods with added sugar. ? Full-fat dairy products.  Do not salt foods before eating.  Try to eat at least 2 vegetarian meals each week.  Eat more home-cooked food and less restaurant, buffet, and fast food.  When eating at a restaurant, ask that your food be prepared with less salt or no salt, if possible. What foods are recommended? The items listed may not be a complete list. Talk with your dietitian about what dietary choices are best for you. Grains Whole-grain or whole-wheat bread. Whole-grain or whole-wheat pasta. Brown rice. [place]. Bulgur. Whole-grain and low-sodium cereals. Pita bread. Low-fat, low-sodium crackers. Whole-wheat flour tortillas. Vegetables Fresh or frozen vegetables (raw, steamed, roasted, or grilled). Low-sodium or reduced-sodium tomato and vegetable juice. Low-sodium or reduced-sodium tomato sauce and tomato paste. Low-sodium or reduced-sodium canned vegetables. Fruits All fresh, dried, or frozen fruit. Canned fruit in natural juice (without added sugar). Meat and other protein foods Skinless chicken or [place]. Ground chicken or [place]. Pork with fat trimmed off. Fish and seafood. Egg whites. Dried beans, peas, or lentils. Unsalted nuts, nut butters, and seeds. Unsalted canned beans. Lean cuts of beef with fat trimmed off. Low-sodium, lean deli meat. Dairy Low-fat (1%) or fat-free (skim) milk. Fat-free, low-fat, or reduced-fat cheeses. Nonfat, low-sodium ricotta or cottage cheese. Low-fat or nonfat yogurt. Low-fat,  low-sodium cheese. Fats and oils Soft margarine without trans fats. Vegetable oil. Low-fat, reduced-fat, or light mayonnaise and salad dressings (reduced-sodium). Canola, safflower, olive, soybean, and sunflower oils. Avocado. Seasoning and other foods Herbs. Spices. Seasoning mixes without salt. Unsalted popcorn and pretzels. Fat-free sweets. What foods are not recommended? The items listed may not be a complete list. Talk with your dietitian about what dietary choices are best for you. Grains Baked goods made with fat, such as croissants, muffins, or some breads. Dry pasta or rice meal packs. Vegetables Creamed or fried vegetables. Vegetables in a cheese sauce. Regular canned vegetables (not low-sodium or reduced-sodium). Regular canned tomato sauce and paste (not low-sodium or reduced-sodium). Regular tomato and vegetable juice (not low-sodium or reduced-sodium). Angie Fava. Olives. Fruits Canned fruit in a light or heavy syrup. Fried fruit. Fruit in cream or butter sauce. Meat and other protein foods Fatty cuts of meat. Ribs. Fried meat. Berniece Salines. Sausage. Bologna and other processed lunch meats. Salami. Fatback. Hotdogs. Bratwurst. Salted nuts and seeds. Canned beans with added salt. Canned or smoked fish. Whole eggs or egg yolks. Chicken or [place] with skin. Dairy Whole or 2% milk, cream, and half-and-half. Whole or full-fat cream cheese. Whole-fat or sweetened yogurt. Full-fat cheese. Nondairy creamers. Whipped toppings. Processed cheese and cheese spreads. Fats and oils Butter. Stick  margarine. Lard. Shortening. Ghee. Bacon fat. Tropical oils, such as coconut, palm kernel, or palm oil. Seasoning and other foods Salted popcorn and pretzels. Onion salt, garlic salt, seasoned salt, table salt, and sea salt. Worcestershire sauce. Tartar sauce. Barbecue sauce. Teriyaki sauce. Soy sauce, including reduced-sodium. Steak sauce. Canned and packaged gravies. Fish sauce. Oyster sauce. Cocktail sauce.  Horseradish that you find on the shelf. Ketchup. Mustard. Meat flavorings and tenderizers. Bouillon cubes. Hot sauce and Tabasco sauce. Premade or packaged marinades. Premade or packaged taco seasonings. Relishes. Regular salad dressings. Where to find more information:  National Heart, Lung, and [place]: https://wilson-eaton.com/  American Heart Association: www.heart.org Summary  The DASH eating plan is a healthy eating plan that has been shown to reduce high blood pressure (hypertension). It may also reduce your risk for type 2 diabetes, heart disease, and stroke.  With the DASH eating plan, you should limit salt (sodium) intake to 2,300 mg a day. If you have hypertension, you may need to reduce your sodium intake to 1,500 mg a day.  When on the DASH eating plan, aim to eat more fresh fruits and vegetables, whole grains, lean proteins, low-fat dairy, and heart-healthy fats.  Work with your health care provider or diet and nutrition specialist (dietitian) to adjust your eating plan to your individual calorie needs. This information is not intended to replace advice given to you by your health care provider. Make sure you discuss any questions you have with your health care provider. Document Released: 03/08/2011 Document Revised: 03/12/2016 Document Reviewed: 03/12/2016 Elsevier Interactive Patient Education  2018 [employer].   IF you received an x-ray today, you will receive an invoice from [facility]. Please contact [facility] at [phone] with questions or concerns regarding your invoice.   IF you received labwork today, you will receive an invoice from [place]. Please contact [facility] at [phone] with questions or concerns regarding your invoice.   Our billing staff will not be able to assist you with questions regarding bills from these companies.  You will be contacted with the lab results as soon as they are available. The fastest way to get your  results is to activate your My Chart account. Instructions are located on the last page of this paperwork. If you have not heard from [place] regarding the results in 2 weeks, please contact this office.

## 2017-12-05 ENCOUNTER — Encounter: Payer: Self-pay | Admitting: *Deleted

## 2017-12-05 ENCOUNTER — Ambulatory Visit: Payer: BC Managed Care – PPO | Admitting: Physician Assistant

## 2017-12-09 ENCOUNTER — Ambulatory Visit (INDEPENDENT_AMBULATORY_CARE_PROVIDER_SITE_OTHER): Payer: BC Managed Care – PPO | Admitting: Physician Assistant

## 2017-12-09 VITALS — BP 152/78 | HR 95 | Temp 98.6°F | Resp 16 | Ht 59.0 in | Wt 250.0 lb

## 2017-12-09 DIAGNOSIS — I10 Essential (primary) hypertension: Secondary | ICD-10-CM

## 2017-12-09 DIAGNOSIS — J45909 Unspecified asthma, uncomplicated: Secondary | ICD-10-CM | POA: Diagnosis not present

## 2017-12-09 MED ORDER — BUDESONIDE-FORMOTEROL FUMARATE 80-4.5 MCG/ACT IN AERO: 2.0000 | INHALATION_SPRAY | Freq: Two times a day (BID) | RESPIRATORY_TRACT | 2 refills | Status: DC

## 2017-12-09 MED ORDER — LISINOPRIL-HYDROCHLOROTHIAZIDE 20-25 MG PO TABS: 1.0000 | ORAL_TABLET | Freq: Every day | ORAL | 3 refills | Status: DC

## 2017-12-09 NOTE — Progress Notes (Signed)
Debra Mendoza  MRN: [id number] DOB: 12/13/1965  PCP: Dorise Hiss, PA-C  Subjective:  Pt is a 52 year old female who  has a past medical history of Hypertension. who presents to clinic for blood pressure check.   HTN - hyzaar 100-12.5mg . Increased Norvasc at last OV one month ago from 5mg  to 10mg  qd. Blood pressure today is 160/101, recheck is 152/78.  Denies chest pain, shortness of breath, dyspnea on exertion, headache, lightheadedness. She admits to snoring however does not believe it is severe, discussed with patient that she should talk with her husband and ask him to evaluate snoring. Never Smoker  She was placed on losartan by pulmonologist for possible medication SE of cough while taking Lisinopril. Her cough has persisted. "I always keep a dry cough". She has been told in the past she may have reflux.  "my blood pressure was controlled with lisinopril"  She needs forms filled out today for her job.  She drives a school bus   HLD - tricor. She is not taking this. "I never took it". She is taking flaxseed meal.   Needs refill of symbicort.   Wt Readings from Last 3 Encounters:  12/09/17 250 lb (113.4 kg)  11/12/17 249 lb 9.6 oz (113.2 kg)  10/07/17 248 lb 3.2 oz (112.6 kg)   Lab Results  Component Value Date   HGBA1C 6.4 (H) 06/20/2017   Review of Systems  Constitutional: Negative for chills, diaphoresis, fatigue and fever.  Respiratory: Positive for cough. Negative for chest tightness, shortness of breath and wheezing.   Cardiovascular: Negative for chest pain and palpitations.  Gastrointestinal: Negative for abdominal pain, diarrhea, nausea and vomiting.    Patient Active Problem List   Diagnosis Date Noted  . Morbid obesity ([place]) 06/20/2017  . Moderate asthma 03/08/2016  . Essential hypertension, benign 03/08/2016  . Numbness and tingling of left leg 11/04/2014  . HTN (hypertension) 07/09/2011  . Obesity 07/09/2011    Current Outpatient  Medications on File Prior to Visit  Medication Sig Dispense Refill  . amLODipine (NORVASC) 10 MG tablet TAKE 1 TABLET BY MOUTH ONCE DAILY 90 tablet 2  . Ascorbic Acid (VITAMIN C) 100 MG tablet Take 1,000 mg by mouth daily.     . Biotin 5000 MCG CAPS Take by mouth.    . Black Cohosh 40 MG CAPS Take 40 mg by mouth.    . Calcium Carbonate-Vitamin D (CALCIUM 600+D PO) Take 1 tablet by mouth daily.    Marland Kitchen losartan-hydrochlorothiazide (HYZAAR) 100-12.5 MG tablet Take 1 tablet by mouth daily. 90 tablet 1  . norethindrone (AYGESTIN) 5 MG tablet Take 5 mg by mouth daily.    . potassium chloride (K-DUR,KLOR-CON) 10 MEQ tablet Take 10 mEq by mouth 2 (two) times daily.    . SYMBICORT 80-4.5 MCG/ACT inhaler INHALE 2 PUFFS INTO THE LUNGS TWICE DAILY 1 Inhaler 2   No current facility-administered medications on file prior to visit.     No Known Allergies   Objective:  BP (!) 160/101   Pulse 95   Temp 98.6 F (37 C) (Oral)   Resp 16   Ht 4\' 11"  (1.499 m)   Wt 250 lb (113.4 kg)   SpO2 96%   BMI 50.49 kg/m   Physical Exam  Constitutional: She is oriented to person, place, and time. She appears well-developed. No distress.  obese  Cardiovascular: Normal rate, regular rhythm and normal heart sounds.  Neurological: She is alert and oriented to person, place,  and time.  Skin: Skin is warm and dry.  Psychiatric: Judgment normal.  Vitals reviewed.   Assessment and Plan :  1. Essential hypertension -Patient presents for follow-up blood pressure.  Blood pressure today is 160/101, recheck is 152/78.  Plan to stop Hyzaar and start Prinzide.  She has been on this in the past which seemed to control her blood pressure however was discontinued from lisinopril due to cough.  However she always "kept a cough".  Return to clinic in 1 week, she will need forms filled out for work.  Patient has prediabetes, need to control sugars with improve lifestyle was discussed.  She voiced understanding. - Recheck  vitals - lisinopril-hydrochlorothiazide (PRINZIDE,ZESTORETIC) 20-25 MG tablet; Take 1 tablet by mouth daily.  Dispense: 90 tablet; Refill: 3  Whitney Redonna Wilbert, PA-C  [facility] [place] 12/09/2017 11:22 AM  Please note: Portions of this report may have been transcribed using dragon voice recognition software. Every effort was made to ensure accuracy; however, inadvertent computerized transcription errors may be present.

## 2017-12-09 NOTE — Patient Instructions (Addendum)
STOP taking Losartan - HCTZ START taking Lisinopril-HCTZ 20-25 mg Continue taking Norvasc  Check home blood pressure readings  Come back and see me in 2-3 weeks to recheck your blood pressure.   Prediabetes Eating Plan Prediabetes-also called impaired glucose tolerance or impaired fasting glucose-is a condition that causes blood sugar (blood glucose) levels to be higher than normal. Following a healthy diet can help to keep prediabetes under control. It can also help to lower the risk of type 2 diabetes and heart disease, which are increased in people who have prediabetes. Along with regular exercise, a healthy diet:  Promotes weight loss.  Helps to control blood sugar levels.  Helps to improve the way that the body uses insulin.  What do I need to know about this eating plan?  Use the glycemic index (GI) to plan your meals. The index tells you how quickly a food will raise your blood sugar. Choose low-GI foods. These foods take a longer time to raise blood sugar.  Pay close attention to the amount of carbohydrates in the food that you eat. Carbohydrates increase blood sugar levels.  Keep track of how many calories you take in. Eating the right amount of calories will help you to achieve a healthy weight. Losing about 7 percent of your starting weight can help to prevent type 2 diabetes.  You may want to follow a Mediterranean diet. This diet includes a lot of vegetables, lean meats or fish, whole grains, fruits, and healthy oils and fats. What foods can I eat? Grains Whole grains, such as whole-wheat or whole-grain breads, crackers, cereals, and pasta. Unsweetened oatmeal. Bulgur. Barley. Quinoa. Brown rice. Corn or whole-wheat flour tortillas or taco shells. Vegetables Lettuce. Spinach. Peas. Beets. Cauliflower. Cabbage. Broccoli. Carrots. Tomatoes. Squash. Eggplant. Herbs. Peppers. Onions. Cucumbers. Brussels sprouts. Fruits Berries. Bananas. Apples. Oranges. Grapes. Papaya.  Mango. Pomegranate. Kiwi. Grapefruit. Cherries. Meats and Other Protein Sources Seafood. Lean meats, such as chicken and [place] or lean cuts of pork and beef. Tofu. Eggs. Nuts. Beans. Dairy Low-fat or fat-free dairy products, such as yogurt, cottage cheese, and cheese. Beverages Water. Tea. Coffee. Sugar-free or diet soda. Seltzer water. Milk. Milk alternatives, such as soy or almond milk. Condiments Mustard. Relish. Low-fat, low-sugar ketchup. Low-fat, low-sugar barbecue sauce. Low-fat or fat-free mayonnaise. Sweets and Desserts Sugar-free or low-fat pudding. Sugar-free or low-fat ice cream and other frozen treats. Fats and Oils Avocado. Walnuts. Olive oil. The items listed above may not be a complete list of recommended foods or beverages. Contact your dietitian for more options. What foods are not recommended? Grains Refined white flour and flour products, such as bread, pasta, snack foods, and cereals. Beverages Sweetened drinks, such as sweet iced tea and soda. Sweets and Desserts Baked goods, such as cake, cupcakes, pastries, cookies, and cheesecake. The items listed above may not be a complete list of foods and beverages to avoid. Contact your dietitian for more information. This information is not intended to replace advice given to you by your health care provider. Make sure you discuss any questions you have with your health care provider. Document Released: 08/03/2014 Document Revised: 08/25/2015 Document Reviewed: 04/14/2014 Elsevier Interactive Patient Education  2017 [employer].  IF you received an x-ray today, you will receive an invoice from [facility]. Please contact [facility] at [phone] with questions or concerns regarding your invoice.   IF you received labwork today, you will receive an invoice from [place]. Please contact [facility] at [phone] with questions or concerns regarding your  invoice.   Our billing staff will not be able to  assist you with questions regarding bills from these companies.  You will be contacted with the lab results as soon as they are available. The fastest way to get your results is to activate your My Chart account. Instructions are located on the last page of this paperwork. If you have not heard from [place] regarding the results in 2 weeks, please contact this office.

## 2017-12-13 ENCOUNTER — Telehealth: Payer: Self-pay | Admitting: Physician Assistant

## 2017-12-13 NOTE — Telephone Encounter (Signed)
Submitted PA for budesonide-formoterol (SYMBICORT) 80-4.5 MCG/ACT inhaler  Sheet is in the pending folder.

## 2017-12-14 NOTE — Telephone Encounter (Signed)
PA came through stating that a PA was NOT REQUIRED.

## 2017-12-17 ENCOUNTER — Telehealth: Payer: Self-pay | Admitting: Physician Assistant

## 2017-12-17 NOTE — Telephone Encounter (Signed)
Called [facility] to follow up on the Symbicort PA. They said that she has enough medication to last until November 2019.

## 2017-12-17 NOTE — Telephone Encounter (Signed)
FYI

## 2017-12-18 ENCOUNTER — Ambulatory Visit: Payer: BC Managed Care – PPO | Admitting: Physician Assistant

## 2017-12-18 VITALS — BP 136/82 | HR 78 | Temp 98.6°F | Resp 16 | Ht 59.0 in | Wt 247.0 lb

## 2017-12-18 DIAGNOSIS — Z23 Encounter for immunization: Secondary | ICD-10-CM

## 2017-12-18 DIAGNOSIS — M545 Low back pain, unspecified: Secondary | ICD-10-CM

## 2017-12-18 DIAGNOSIS — I10 Essential (primary) hypertension: Secondary | ICD-10-CM | POA: Diagnosis not present

## 2017-12-18 LAB — POC MICROSCOPIC URINALYSIS (UMFC): Mucus: ABSENT

## 2017-12-18 LAB — POCT URINALYSIS DIP (MANUAL ENTRY)
Bilirubin, UA: NEGATIVE
Glucose, UA: NEGATIVE mg/dL
Ketones, POC UA: NEGATIVE mg/dL
Leukocytes, UA: NEGATIVE
Nitrite, UA: NEGATIVE
Protein Ur, POC: NEGATIVE mg/dL
Spec Grav, UA: 1.02 (ref 1.010–1.025)
Urobilinogen, UA: 0.2 U/dL
pH, UA: 5.5 (ref 5.0–8.0)

## 2017-12-18 MED ORDER — CYCLOBENZAPRINE HCL 10 MG PO TABS: 10.0000 mg | ORAL_TABLET | Freq: Three times a day (TID) | ORAL | 0 refills | Status: DC | PRN

## 2017-12-18 NOTE — Progress Notes (Signed)
Debra Mendoza  MRN: [id number] DOB: [birth year]  PCP: Dorise Hiss, PA-C  Subjective:  Pt is a 52 year old female who presents to clinic for f/u HTN. She was here 9/9: Stopped Hyzaar and started Prinzide 20-25 mg qd. Endorses compliance. blood pressure today is 138/90, recheck is 136/82. Home blood pressures are 120-130/60-70. Denies cough or other SE.  She is trying to walk more. She is baking and boiling more rather than frying. Plans to start going to [employer].   Prediabetes - last A1C 6.4, 6 months ago.   Wt Readings from Last 3 Encounters:  12/18/17 247 lb (112 kg)  12/09/17 250 lb (113.4 kg)  11/12/17 249 lb 9.6 oz (113.2 kg)   Endorses left lower back pain.  Pain does not radiate. No MOI. She has been taking ibuprofen, which helps some. Denies urinary symptoms, flank pain, vaginal symptoms, fever, chills, saddle paresthesia.  Review of Systems  Respiratory: Negative for cough, chest tightness and shortness of breath.   Cardiovascular: Negative for chest pain, palpitations and leg swelling.  Genitourinary: Negative for decreased urine volume, dysuria, flank pain, frequency, urgency, vaginal bleeding, vaginal discharge and vaginal pain.  Musculoskeletal: Positive for back pain (left low back). Negative for gait problem.  Skin: Negative.     Patient Active Problem List   Diagnosis Date Noted  . Morbid obesity ([place]) 06/20/2017  . Moderate asthma 03/08/2016  . Essential hypertension, benign 03/08/2016  . Numbness and tingling of left leg 11/04/2014  . HTN (hypertension) 07/09/2011  . Obesity 07/09/2011    Current Outpatient Medications on File Prior to Visit  Medication Sig Dispense Refill  . amLODipine (NORVASC) 10 MG tablet TAKE 1 TABLET BY MOUTH ONCE DAILY 90 tablet 2  . Ascorbic Acid (VITAMIN C) 100 MG tablet Take 1,000 mg by mouth daily.     . Biotin 5000 MCG CAPS Take by mouth.    . Black Cohosh 40 MG CAPS Take 40 mg by mouth.    .  budesonide-formoterol (SYMBICORT) 80-4.5 MCG/ACT inhaler Inhale 2 puffs into the lungs 2 (two) times daily. 1 Inhaler 2  . Calcium Carbonate-Vitamin D (CALCIUM 600+D PO) Take 1 tablet by mouth daily.    Marland Kitchen lisinopril-hydrochlorothiazide (PRINZIDE,ZESTORETIC) 20-25 MG tablet Take 1 tablet by mouth daily. 90 tablet 3  . norethindrone (AYGESTIN) 5 MG tablet Take 5 mg by mouth daily.    . potassium chloride (K-DUR,KLOR-CON) 10 MEQ tablet Take 10 mEq by mouth 2 (two) times daily.     No current facility-administered medications on file prior to visit.     No Known Allergies   Objective:  BP 136/82   Pulse 78   Temp 98.6 F (37 C) (Oral)   Resp 16   Ht 4\' 11"  (1.499 m)   Wt 247 lb (112 kg)   SpO2 99%   BMI 49.89 kg/m   Physical Exam  Constitutional: She is oriented to person, place, and time. She appears well-developed. No distress.  Cardiovascular: Normal rate, regular rhythm and normal heart sounds.  Musculoskeletal:       Lumbar back: She exhibits tenderness. She exhibits normal range of motion, no bony tenderness and no deformity.       Back:  Neurological: She is alert and oriented to person, place, and time.  Skin: Skin is warm and dry.  Psychiatric: Judgment normal.  Vitals reviewed.  Results for orders placed or performed in visit on 12/18/17  POCT urinalysis dipstick  Result Value Ref Range  Color, UA yellow yellow   Clarity, UA clear clear   Glucose, UA negative negative mg/dL   Bilirubin, UA negative negative   Ketones, POC UA negative negative mg/dL   Spec Grav, UA 1.020 1.010 - 1.025   Blood, UA moderate (A) negative   pH, UA 5.5 5.0 - 8.0   Protein Ur, POC negative negative mg/dL   Urobilinogen, UA 0.2 0.2 or 1.0 E.U./dL   Nitrite, UA Negative Negative   Leukocytes, UA Negative Negative  POCT Microscopic Urinalysis ([facility])  Result Value Ref Range   WBC,UR,HPF,POC None None WBC/hpf   RBC,UR,HPF,POC Few (A) None RBC/hpf   Bacteria None None, Too numerous to  count   Mucus Absent Absent   Epithelial Cells, UR Per Microscopy Few (A) None, Too numerous to count cells/hpf    Assessment and Plan :  1. Essential hypertension - Pt present for f/u HTN. Last OV stopped Hyzaar and started Prinzide 20-25 mg qd. Endorses compliance. blood pressure today is 138/90, recheck is 136/82. Denies SE. Cont dose as prescribed.  Con't lifestyle improvements. RTC in 6 months for annual exam. Paperwork filled out and faxed for her work.  - Recheck vitals  2. Left-sided low back pain without sciatica, unspecified chronicity - Pt endorses left low back pain. +hematuria on UA and micro. HPI not consistent with nephrolithiasis. We do not have an x-ray tech today to get KUB. Plan to treat for MSK pain as she is TTP on PE. RTC if symptoms worsen or fail to improve.   - POCT urinalysis dipstick - POCT Microscopic Urinalysis ([facility]) - cyclobenzaprine (FLEXERIL) 10 MG tablet; Take 1 tablet (10 mg total) by mouth 3 (three) times daily as needed for muscle spasms.  Dispense: 30 tablet; Refill: 0  3. Need for influenza vaccination - Administered by CMA - Flu Vaccine QUAD 36+ mos IM   Mercer Pod, PA-C  [facility] [place] 12/18/2017 10:48 AM  Please note: Portions of this report may have been transcribed using dragon voice recognition software. Every effort was made to ensure accuracy; however, inadvertent computerized transcription errors may be present.

## 2017-12-18 NOTE — Patient Instructions (Addendum)
Keep up the great work!  See below for DASH diet to help lower your blood pressure and weight.  Continue to slowly increase the duration of your exercise. Try to start exercising 2-4x/week for 20-30 min a session.   Come back for your annual exam in March. We will recheck cholesterol and blood sugar levels.  ---------------------------------- For your back: Flexeril is a muscle relaxer. This may make you drowsy.   Apply moist heat to the area. Wet a towel and wring it out so it is damp. Put it in the microwave for about 15-20 seconds - long enough to make it hot, but not too hot to apply to your skin causing burns. Do this for about 20-30 minutes, 3-4 times a day.   Perform gentle, light stretches 2-3 times a day.   Put a tennis ball between your back and a wall. Gentle massage the area with rolling the ball around the affected area. Try a foam roller.   Stay well hydrated - try to drink 32-64 oz/day.    DASH Eating Plan DASH stands for "Dietary Approaches to Stop Hypertension." The DASH eating plan is a healthy eating plan that has been shown to reduce high blood pressure (hypertension). It may also reduce your risk for type 2 diabetes, heart disease, and stroke. The DASH eating plan may also help with weight loss. What are tips for following this plan? General guidelines  Avoid eating more than 2,300 mg (milligrams) of salt (sodium) a day. If you have hypertension, you may need to reduce your sodium intake to 1,500 mg a day.  Limit alcohol intake to no more than 1 drink a day for nonpregnant women and 2 drinks a day for men. One drink equals 12 oz of beer, 5 oz of wine, or 1 oz of hard liquor.  Work with your health care provider to maintain a healthy body weight or to lose weight. Ask what an ideal weight is for you.  Get at least 30 minutes of exercise that causes your heart to beat faster (aerobic exercise) most days of the week. Activities may include walking, swimming, or  biking.  Work with your health care provider or diet and nutrition specialist (dietitian) to adjust your eating plan to your individual calorie needs. Reading food labels  Check food labels for the amount of sodium per serving. Choose foods with less than 5 percent of the Daily Value of sodium. Generally, foods with less than 300 mg of sodium per serving fit into this eating plan.  To find whole grains, look for the word "whole" as the first word in the ingredient list. Shopping  Buy products labeled as "low-sodium" or "no salt added."  Buy fresh foods. Avoid canned foods and premade or frozen meals. Cooking  Avoid adding salt when cooking. Use salt-free seasonings or herbs instead of table salt or sea salt. Check with your health care provider or pharmacist before using salt substitutes.  Do not fry foods. Cook foods using healthy methods such as baking, boiling, grilling, and broiling instead.  Cook with heart-healthy oils, such as olive, canola, soybean, or sunflower oil. Meal planning   Eat a balanced diet that includes: ? 5 or more servings of fruits and vegetables each day. At each meal, try to fill half of your plate with fruits and vegetables. ? Up to 6-8 servings of whole grains each day. ? Less than 6 oz of lean meat, poultry, or fish each day. A 3-oz serving of meat is  about the same size as a deck of cards. One egg equals 1 oz. ? 2 servings of low-fat dairy each day. ? A serving of nuts, seeds, or beans 5 times each week. ? Heart-healthy fats. Healthy fats called Omega-3 fatty acids are found in foods such as flaxseeds and coldwater fish, like sardines, salmon, and mackerel.  Limit how much you eat of the following: ? Canned or prepackaged foods. ? Food that is high in trans fat, such as fried foods. ? Food that is high in saturated fat, such as fatty meat. ? Sweets, desserts, sugary drinks, and other foods with added sugar. ? Full-fat dairy products.  Do not salt  foods before eating.  Try to eat at least 2 vegetarian meals each week.  Eat more home-cooked food and less restaurant, buffet, and fast food.  When eating at a restaurant, ask that your food be prepared with less salt or no salt, if possible. What foods are recommended? The items listed may not be a complete list. Talk with your dietitian about what dietary choices are best for you. Grains Whole-grain or whole-wheat bread. Whole-grain or whole-wheat pasta. Brown rice. [place]. Bulgur. Whole-grain and low-sodium cereals. Pita bread. Low-fat, low-sodium crackers. Whole-wheat flour tortillas. Vegetables Fresh or frozen vegetables (raw, steamed, roasted, or grilled). Low-sodium or reduced-sodium tomato and vegetable juice. Low-sodium or reduced-sodium tomato sauce and tomato paste. Low-sodium or reduced-sodium canned vegetables. Fruits All fresh, dried, or frozen fruit. Canned fruit in natural juice (without added sugar). Meat and other protein foods Skinless chicken or [place]. Ground chicken or [place]. Pork with fat trimmed off. Fish and seafood. Egg whites. Dried beans, peas, or lentils. Unsalted nuts, nut butters, and seeds. Unsalted canned beans. Lean cuts of beef with fat trimmed off. Low-sodium, lean deli meat. Dairy Low-fat (1%) or fat-free (skim) milk. Fat-free, low-fat, or reduced-fat cheeses. Nonfat, low-sodium ricotta or cottage cheese. Low-fat or nonfat yogurt. Low-fat, low-sodium cheese. Fats and oils Soft margarine without trans fats. Vegetable oil. Low-fat, reduced-fat, or light mayonnaise and salad dressings (reduced-sodium). Canola, safflower, olive, soybean, and sunflower oils. Avocado. Seasoning and other foods Herbs. Spices. Seasoning mixes without salt. Unsalted popcorn and pretzels. Fat-free sweets. What foods are not recommended? The items listed may not be a complete list. Talk with your dietitian about what dietary choices are best for you. Grains Baked goods  made with fat, such as croissants, muffins, or some breads. Dry pasta or rice meal packs. Vegetables Creamed or fried vegetables. Vegetables in a cheese sauce. Regular canned vegetables (not low-sodium or reduced-sodium). Regular canned tomato sauce and paste (not low-sodium or reduced-sodium). Regular tomato and vegetable juice (not low-sodium or reduced-sodium). Angie Fava. Olives. Fruits Canned fruit in a light or heavy syrup. Fried fruit. Fruit in cream or butter sauce. Meat and other protein foods Fatty cuts of meat. Ribs. Fried meat. Berniece Salines. Sausage. Bologna and other processed lunch meats. Salami. Fatback. Hotdogs. Bratwurst. Salted nuts and seeds. Canned beans with added salt. Canned or smoked fish. Whole eggs or egg yolks. Chicken or [place] with skin. Dairy Whole or 2% milk, cream, and half-and-half. Whole or full-fat cream cheese. Whole-fat or sweetened yogurt. Full-fat cheese. Nondairy creamers. Whipped toppings. Processed cheese and cheese spreads. Fats and oils Butter. Stick margarine. Lard. Shortening. Ghee. Bacon fat. Tropical oils, such as coconut, palm kernel, or palm oil. Seasoning and other foods Salted popcorn and pretzels. Onion salt, garlic salt, seasoned salt, table salt, and sea salt. Worcestershire sauce. Tartar sauce. Barbecue sauce. Teriyaki sauce. Soy  sauce, including reduced-sodium. Steak sauce. Canned and packaged gravies. Fish sauce. Oyster sauce. Cocktail sauce. Horseradish that you find on the shelf. Ketchup. Mustard. Meat flavorings and tenderizers. Bouillon cubes. Hot sauce and Tabasco sauce. Premade or packaged marinades. Premade or packaged taco seasonings. Relishes. Regular salad dressings. Where to find more information:  National Heart, Lung, and [place]: https://wilson-eaton.com/  American Heart Association: www.heart.org Summary  The DASH eating plan is a healthy eating plan that has been shown to reduce high blood pressure (hypertension). It may also reduce  your risk for type 2 diabetes, heart disease, and stroke.  With the DASH eating plan, you should limit salt (sodium) intake to 2,300 mg a day. If you have hypertension, you may need to reduce your sodium intake to 1,500 mg a day.  When on the DASH eating plan, aim to eat more fresh fruits and vegetables, whole grains, lean proteins, low-fat dairy, and heart-healthy fats.  Work with your health care provider or diet and nutrition specialist (dietitian) to adjust your eating plan to your individual calorie needs. This information is not intended to replace advice given to you by your health care provider. Make sure you discuss any questions you have with your health care provider. Document Released: 03/08/2011 Document Revised: 03/12/2016 Document Reviewed: 03/12/2016 Elsevier Interactive Patient Education  [employer].

## 2018-01-20 ENCOUNTER — Other Ambulatory Visit: Payer: Self-pay | Admitting: Physician Assistant

## 2018-01-20 DIAGNOSIS — E785 Hyperlipidemia, unspecified: Secondary | ICD-10-CM

## 2018-01-20 NOTE — Telephone Encounter (Signed)
Left message for pt to return call to office to verify if pt was still taking this medication. Not on current medication list.

## 2018-01-21 NOTE — Telephone Encounter (Signed)
Pt states she is no longer taking this medication.  

## 2018-05-29 IMAGING — MR MR PELVIS WO/W CM
7 of 15 series · 19 of 48 positions shown · IV contrast (multihance)
Comparison: Ultrasound on 02/27/2017

CLINICAL DATA: Severe menorrhagia. Recurrent vaginal bleeding.
Personal history of fibroids. Endometrial ablation procedure
approximately 9 years ago.

EXAM:
MRI PELVIS WITHOUT AND WITH CONTRAST
TECHNIQUE: Multiplanar multisequence MR imaging of the pelvis was performed
both before and after intravenous contrast administration. Initial
exam on 04/15/2017 was of limited diagnostic utility due to in
homogeneous signal throughout pelvis, and patient returned for
repeat exam on 04/17/2017.
CONTRAST:  20 mL MultiHance

[Series 4: cor ssfse overview · coronal · 6.0mm · 0.78mm/px · 3 of 39 slices shown]
[im 1/39]
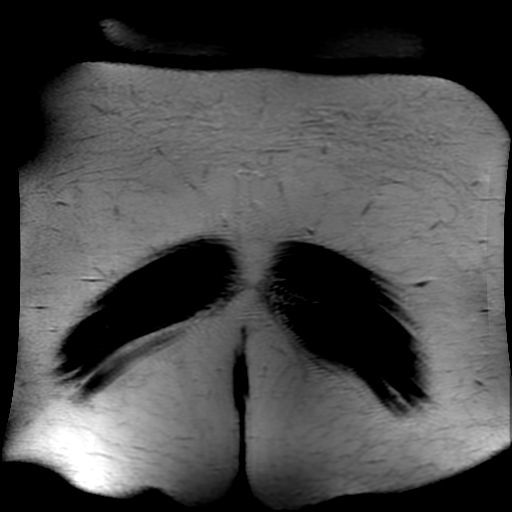
[im 20/39]
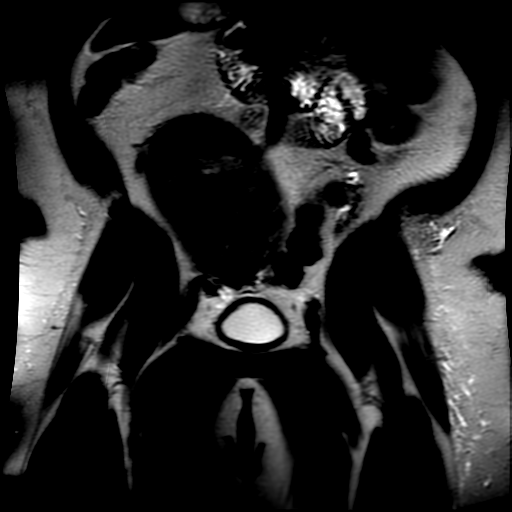
[im 39/39]
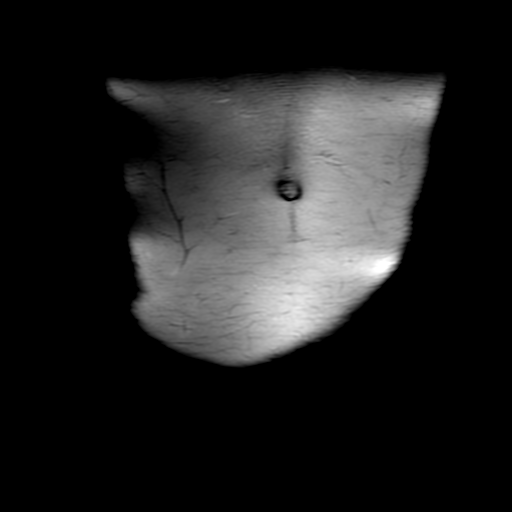

[Series 7: T2 · sagittal · 5.0mm · 0.59mm/px · 2 of 38 slices shown (1 of 2)]
[im 1/38]
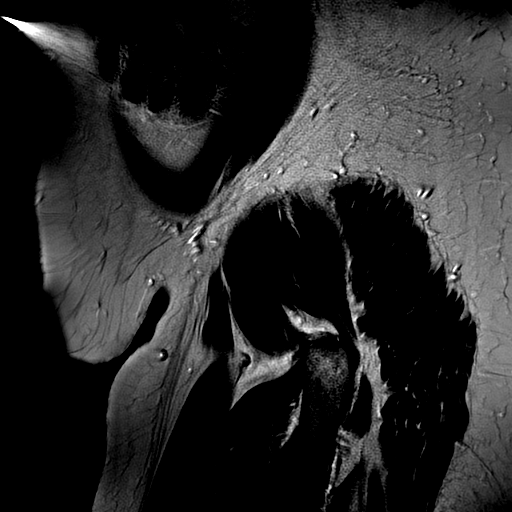
[im 38/38]
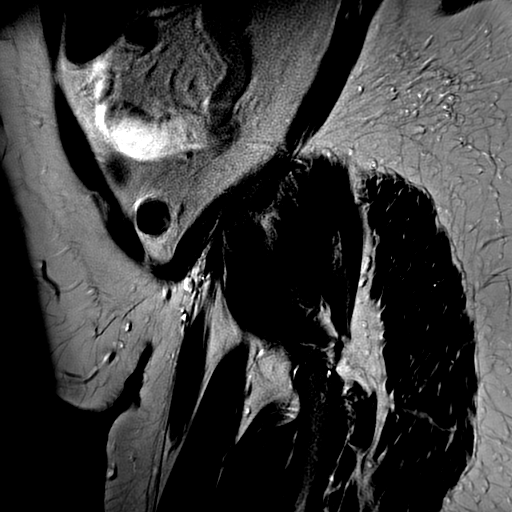

[Series 8: T2 · axial · 5.0mm · 0.55mm/px · z∈[-129,+153]mm · 3 of 48 slices shown (2 of 2)]
[im 1/48]
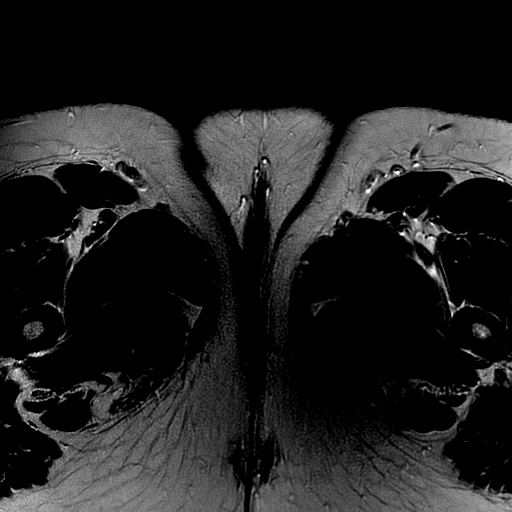
[im 24/48]
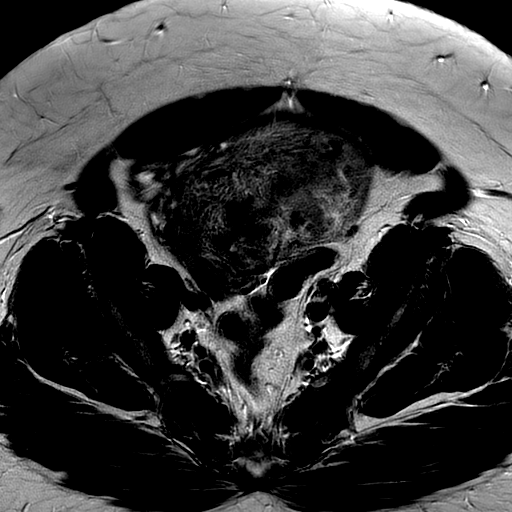
[im 48/48]
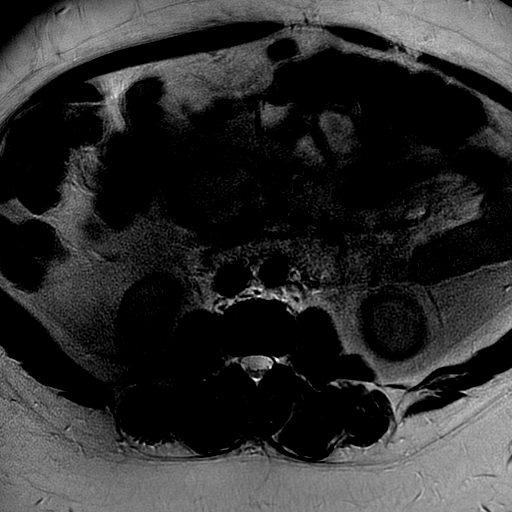

[Series 9: T2 fat-sat · axial · 5.0mm · 0.55mm/px · z∈[-129,+153]mm · 3 of 48 slices shown]
[im 1/48]
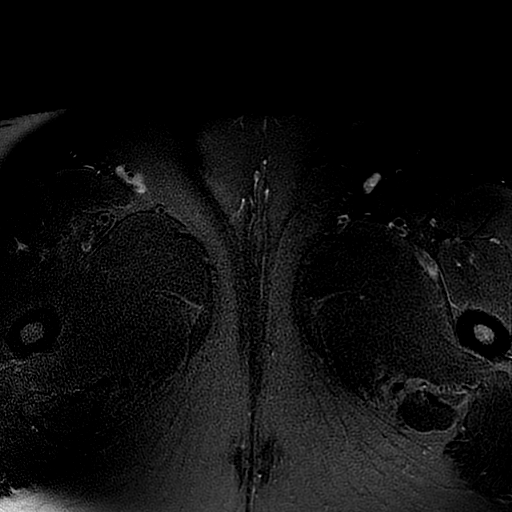
[im 24/48]
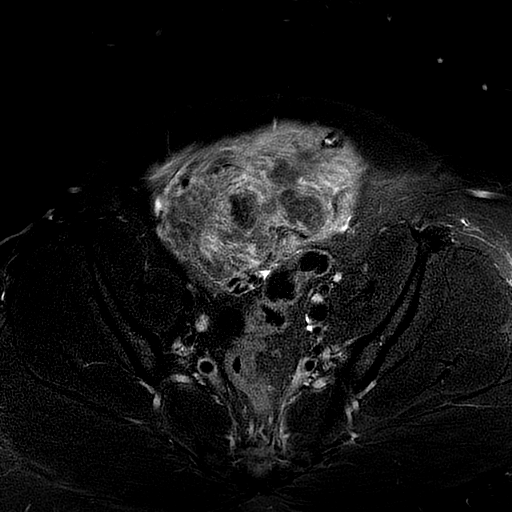
[im 48/48]
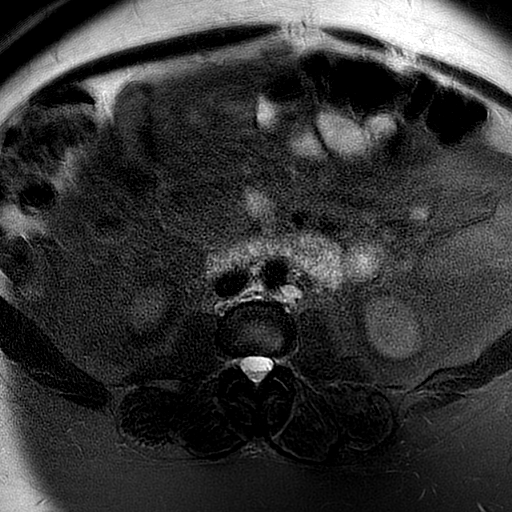

[Series 10: T1 fat-sat · axial · 5.0mm · 0.55mm/px · z∈[-129,+153]mm · 3 of 48 slices shown]
[im 1/48]
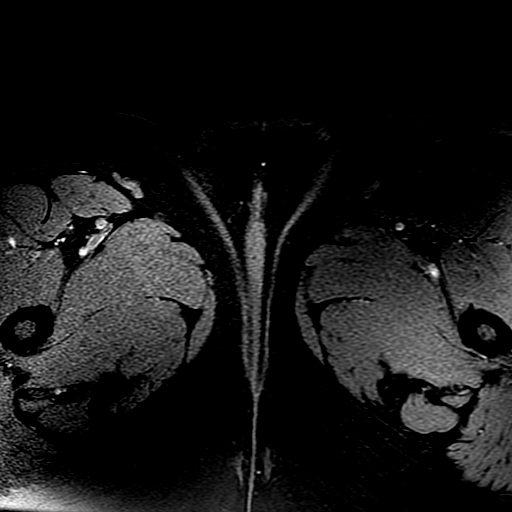
[im 24/48]
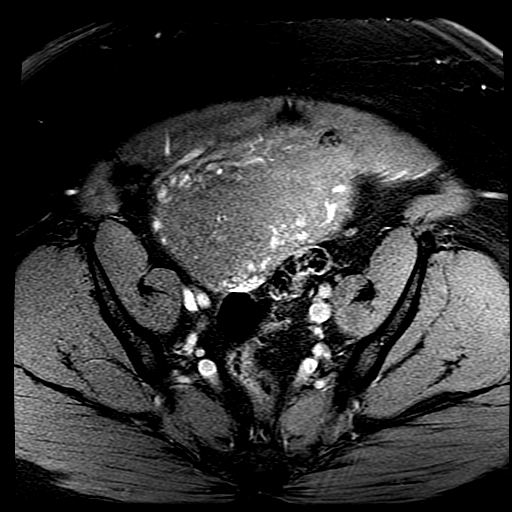
[im 48/48]
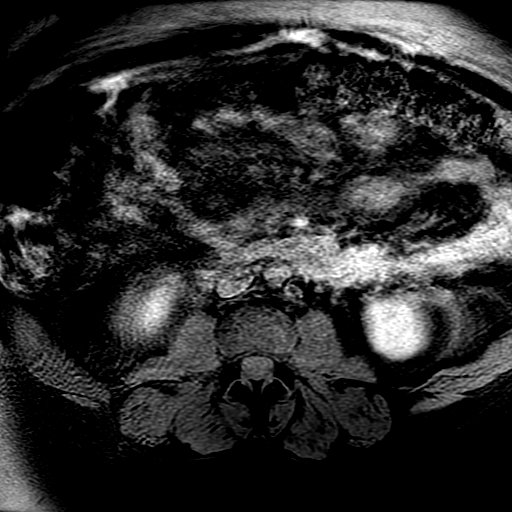

[Series 16: T1 fat-sat post-contrast · axial · 5.0mm · 0.55mm/px · z∈[-129,+153]mm · 3 of 48 slices shown]
[im 1/48]
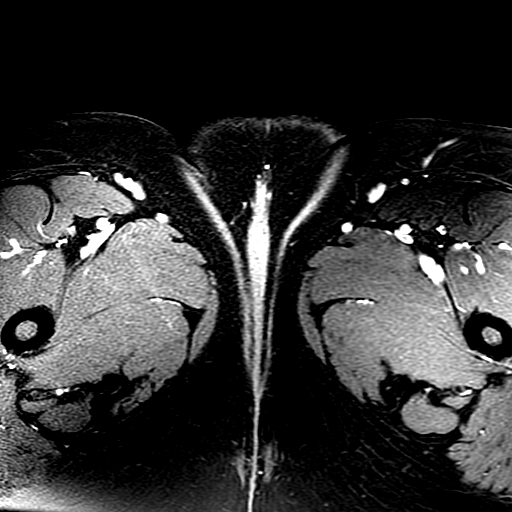
[im 24/48]
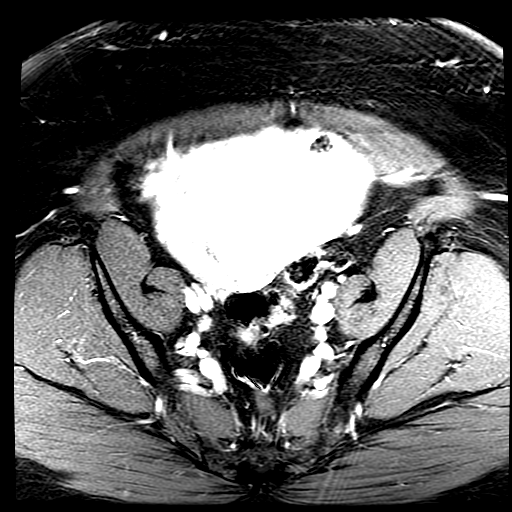
[im 48/48]
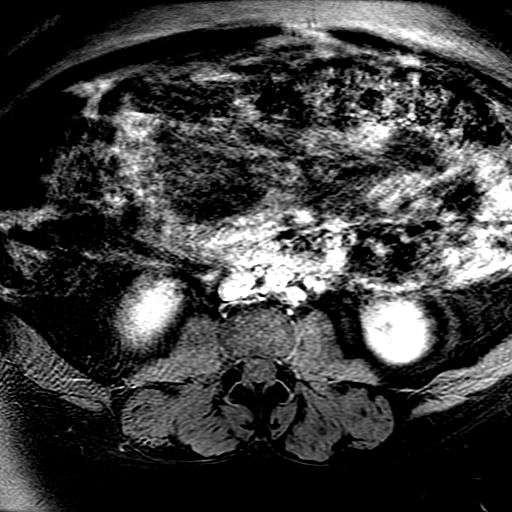

[Series 17: T1 post-contrast · sagittal · 5.0mm · 0.59mm/px · 2 of 38 slices shown]
[im 1/38]
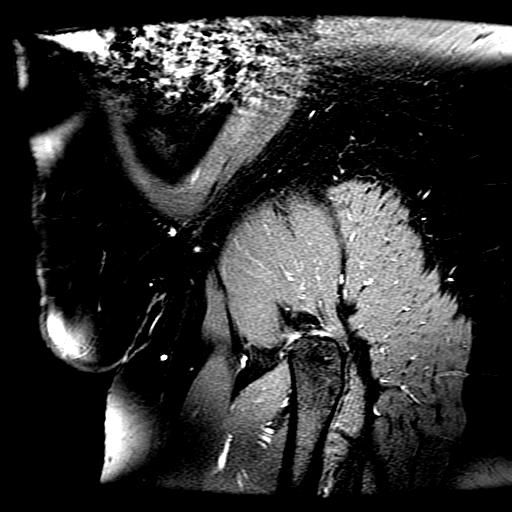
[im 38/38]
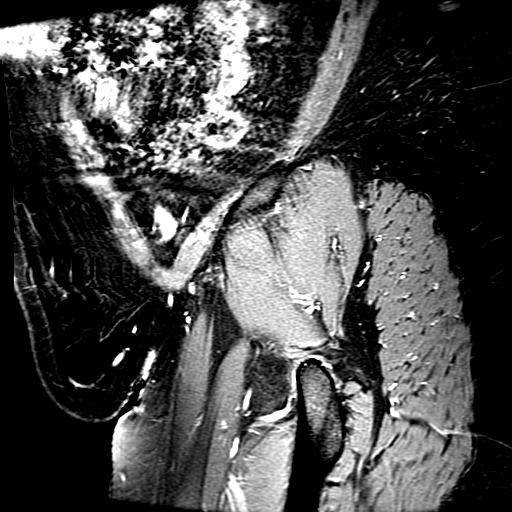

[19 of 48 positions shown; findings below may reference images not displayed]

FINDINGS: Urinary Tract: No bladder or urethral abnormality identified.

Bowel:  Unremarkable visualized pelvic bowel loops.

Vascular/Lymphatic: No pathologically enlarged lymph nodes or other
significant abnormality.

Reproductive:

-- Uterus: Measures 17.5 x 12.9 x 17.9 cm (volume = 0802 cm^3).
Numerous fibroids are seen which involve the uterus diffusely. These
fibroids are mostly intramural in location, with some subserosal and
submucosal fibroids identified. Endometrium is not well visualized
due to displacement by submucosal fibroids, however no abnormal
endometrial thickening seen.

-- Intracavitary fibroids:  None.

-- Pedunculated fibroids: None.

-- Fibroid contrast enhancement: All fibroids show contrast
enhancement, without significant degeneration/devascularization.

-- Right ovary: Not directly visualized, however no adnexal mass
identified.

-- Left ovary: Not directly visualized, however no adnexal mass
identified.

Other: No abnormal free fluid. Incidentally noted tiny Kenzo
Lutonadio cyst measuring 1.2 cm in the upper right vaginal wall.

Musculoskeletal:  Unremarkable.
IMPRESSION: Markedly enlarged uterus with diffuse involvement by fibroids. No
intracavitary or pedunculated fibroids identified.

## 2018-06-15 ENCOUNTER — Encounter: Payer: Self-pay | Admitting: Internal Medicine

## 2018-06-24 ENCOUNTER — Other Ambulatory Visit: Payer: Self-pay | Admitting: Physician Assistant

## 2018-06-24 DIAGNOSIS — J45909 Unspecified asthma, uncomplicated: Secondary | ICD-10-CM

## 2018-07-18 ENCOUNTER — Other Ambulatory Visit: Payer: Self-pay | Admitting: Family Medicine

## 2018-07-18 ENCOUNTER — Other Ambulatory Visit: Payer: Self-pay | Admitting: Physician Assistant

## 2018-07-18 DIAGNOSIS — J45909 Unspecified asthma, uncomplicated: Secondary | ICD-10-CM

## 2018-07-18 NOTE — Telephone Encounter (Signed)
Requested Prescriptions  Pending Prescriptions Disp Refills  . SYMBICORT 80-4.5 MCG/ACT inhaler [Pharmacy Med Name: SYMBICORT 80-4.5 MCG INHALER] 10.2 Inhaler 0    Sig: TAKE 2 PUFFS BY MOUTH TWICE A DAY     Pulmonology:  Combination Products Passed - 07/18/2018  2:35 PM      Passed - Valid encounter within last 12 months    Recent Outpatient Visits          7 months ago Essential hypertension   [facility] at [facility], Gelene Mink, PA-C   7 months ago Essential hypertension   [facility] at [facility], [place], PA-C   8 months ago Elevated blood pressure reading   [facility] at [facility], [place], PA-C   9 months ago Hyperlipidemia, unspecified hyperlipidemia type   [facility] at [facility], Gelene Mink, PA-C   1 year ago Annual physical exam   [facility] at [place], [place], [place]

## 2018-07-29 ENCOUNTER — Other Ambulatory Visit: Payer: Self-pay | Admitting: Family Medicine

## 2018-07-29 DIAGNOSIS — J45909 Unspecified asthma, uncomplicated: Secondary | ICD-10-CM

## 2018-08-28 ENCOUNTER — Other Ambulatory Visit: Payer: Self-pay | Admitting: Physician Assistant

## 2018-08-28 DIAGNOSIS — I10 Essential (primary) hypertension: Secondary | ICD-10-CM

## 2018-08-30 ENCOUNTER — Other Ambulatory Visit: Payer: Self-pay | Admitting: Family Medicine

## 2018-08-30 DIAGNOSIS — J45909 Unspecified asthma, uncomplicated: Secondary | ICD-10-CM

## 2018-09-21 ENCOUNTER — Other Ambulatory Visit: Payer: Self-pay | Admitting: Physician Assistant

## 2018-09-21 DIAGNOSIS — I10 Essential (primary) hypertension: Secondary | ICD-10-CM

## 2018-09-22 ENCOUNTER — Other Ambulatory Visit: Payer: Self-pay | Admitting: Physician Assistant

## 2018-09-22 DIAGNOSIS — J45909 Unspecified asthma, uncomplicated: Secondary | ICD-10-CM

## 2018-09-22 NOTE — Telephone Encounter (Signed)
mcvey pt, please advise

## 2018-11-11 ENCOUNTER — Other Ambulatory Visit: Payer: Self-pay

## 2018-11-11 ENCOUNTER — Telehealth (INDEPENDENT_AMBULATORY_CARE_PROVIDER_SITE_OTHER): Payer: BC Managed Care – PPO | Admitting: Family Medicine

## 2018-11-11 ENCOUNTER — Inpatient Hospital Stay
Admission: RE | Admit: 2018-11-11 | Discharge: 2018-11-11 | Disposition: A | Discharge status: Home / Self Care | Payer: BC Managed Care – PPO | Source: Ambulatory Visit

## 2018-11-11 DIAGNOSIS — R7303 Prediabetes: Secondary | ICD-10-CM | POA: Diagnosis not present

## 2018-11-11 DIAGNOSIS — J45909 Unspecified asthma, uncomplicated: Secondary | ICD-10-CM | POA: Diagnosis not present

## 2018-11-11 DIAGNOSIS — E785 Hyperlipidemia, unspecified: Secondary | ICD-10-CM

## 2018-11-11 DIAGNOSIS — I10 Essential (primary) hypertension: Secondary | ICD-10-CM

## 2018-11-11 LAB — HM MAMMOGRAPHY

## 2018-11-11 MED ORDER — ALBUTEROL SULFATE HFA 108 (90 BASE) MCG/ACT IN AERS: 2.0000 | INHALATION_SPRAY | Freq: Four times a day (QID) | RESPIRATORY_TRACT | 3 refills | Status: DC | PRN

## 2018-11-11 MED ORDER — LOSARTAN POTASSIUM-HCTZ 50-12.5 MG PO TABS: 1.0000 | ORAL_TABLET | Freq: Every day | ORAL | 2 refills | Status: DC

## 2018-11-11 MED ORDER — POTASSIUM CHLORIDE CRYS ER 10 MEQ PO TBCR: 10.0000 meq | EXTENDED_RELEASE_TABLET | Freq: Two times a day (BID) | ORAL | 2 refills | Status: DC

## 2018-11-11 MED ORDER — BUDESONIDE-FORMOTEROL FUMARATE 80-4.5 MCG/ACT IN AERO: 2.0000 | INHALATION_SPRAY | Freq: Every day | RESPIRATORY_TRACT | 5 refills | Status: DC

## 2018-11-11 MED ORDER — AMLODIPINE BESYLATE 10 MG PO TABS: ORAL_TABLET | ORAL | 2 refills | Status: DC

## 2018-11-11 NOTE — Progress Notes (Signed)
Virtual Visit Note  I connected with patient on 11/11/18 at 539pm by video, doximity and verified that I am speaking with the correct person using two identifiers. Debra Mendoza is currently located at home and patient is currently with them during visit. The provider, Rutherford Guys, MD is located in their office at time of visit.  I discussed the limitations, risks, security and privacy concerns of performing an evaluation and management service by telephone and the availability of in person appointments. I also discussed with the patient that there may be a patient responsible charge related to this service. The patient expressed understanding and agreed to proceed.   CC: TOC, McVey PA-C Last OV Sept 2019  HPI ? PMH: HTN, asthma, prediabetes  Checks her BP occasionally Last week, 136/76 Takes amlodipine, lisinopril, hctz Lisinopril causes occ cough and wheezing Takes potassium regularly Denies any CP, palpitations, edema  Breathing is going well No SOB Using symbicort once a day Has not needed albuterol in a long time  Denies any n/v, abd pain Denies polydipsia or polyuria  Feels weight has been stable Lab Results  Component Value Date   CREATININE 1.03 (H) 06/20/2017   BUN 11 06/20/2017   NA 141 06/20/2017   K 4.1 06/20/2017   CL 99 06/20/2017   CO2 23 06/20/2017   Lab Results  Component Value Date   CHOL 142 10/07/2017   HDL 25 (L) 10/07/2017   LDLCALC 102 (H) 10/07/2017   TRIG 77 10/07/2017   CHOLHDL 5.7 (H) 10/07/2017   Lab Results  Component Value Date   HGBA1C 6.4 (H) 06/20/2017    BP Readings from Last 3 Encounters:  12/18/17 136/82  12/09/17 (!) 152/78  11/12/17 (!) 178/110   No Known Allergies  Prior to Admission medications   Medication Sig Start Date End Date Taking? Authorizing Provider  amLODipine (NORVASC) 10 MG tablet TAKE 1 TABLET BY MOUTH EVERY DAY 08/28/18   McVey, Gelene Mink, PA-C  Ascorbic Acid (VITAMIN C) 100 MG  tablet Take 1,000 mg by mouth daily.     [provider]  Biotin 5000 MCG CAPS Take by mouth.    [provider]  Black Cohosh 40 MG CAPS Take 40 mg by mouth.    [provider]  budesonide-formoterol (SYMBICORT) 80-4.5 MCG/ACT inhaler TAKE 2 PUFFS BY MOUTH TWICE A DAY 08/30/18   McVey, Gelene Mink, PA-C  Calcium Carbonate-Vitamin D (CALCIUM 600+D PO) Take 1 tablet by mouth daily.    [provider]  cyclobenzaprine (FLEXERIL) 10 MG tablet Take 1 tablet (10 mg total) by mouth 3 (three) times daily as needed for muscle spasms. 12/18/17   McVey, Gelene Mink, PA-C  lisinopril-hydrochlorothiazide (PRINZIDE,ZESTORETIC) 20-25 MG tablet Take 1 tablet by mouth daily. 12/09/17   McVey, Gelene Mink, PA-C  norethindrone (AYGESTIN) 5 MG tablet Take 5 mg by mouth daily.    [provider]  potassium chloride (K-DUR,KLOR-CON) 10 MEQ tablet Take 10 mEq by mouth 2 (two) times daily.    [provider]    Past Medical History:  Diagnosis Date  . Hypertension     Past Surgical History:  Procedure Laterality Date  . IR RADIOLOGIST EVAL & MGMT  04/09/2017  . MYOMECTOMY    . NOVASURE ABLATION      Social History   Tobacco Use  . Smoking status: Never Smoker  . Smokeless tobacco: Never Used  Substance Use Topics  . Alcohol use: Yes    Comment: occassional  Family History  Problem Relation Age of Onset  . Hypertension Mother   . Hypertension Father   . Heart disease Father   . Hypertension Maternal Grandmother   . Breast cancer Paternal Aunt   . Breast cancer Maternal Aunt   . Colon cancer Neg Hx     ROS Per hpi  Objective  Vitals as reported by the patient: none   ASSESSMENT and PLAN  1. Essential hypertension - Comprehensive metabolic panel; Future - TSH; Future - CBC; Future - amLODipine (NORVASC) 10 MG tablet; TAKE 1 TABLET BY MOUTH EVERY DAY  2. Moderate asthma without complication, unspecified whether  persistent - budesonide-formoterol (SYMBICORT) 80-4.5 MCG/ACT inhaler; Inhale 2 puffs into the lungs daily.  3. Hyperlipidemia, unspecified hyperlipidemia type - Lipid panel; Future  4. Prediabetes - Hemoglobin A1c; Future  Meds refilled. Labs pending.   Other orders - losartan-hydrochlorothiazide (HYZAAR) 50-12.5 MG tablet; Take 1 tablet by mouth daily. - potassium chloride (K-DUR) 10 MEQ tablet; Take 1 tablet (10 mEq total) by mouth 2 (two) times daily. - albuterol (VENTOLIN HFA) 108 (90 Base) MCG/ACT inhaler; Inhale 2 puffs into the lungs every 6 (six) hours as needed for wheezing or shortness of breath.  FOLLOW-UP: Fasting labs and BP check in 1 week. Routine followup in 3 months.    The above assessment and management plan was discussed with the patient. The patient verbalized understanding of and has agreed to the management plan. Patient is aware to call the clinic if symptoms persist or worsen. Patient is aware when to return to the clinic for a follow-up visit. Patient educated on when it is appropriate to go to the emergency department.    I provided 16 minutes of non-face-to-face time during this encounter.  Rutherford Guys, MD Primary [facility] at [place], [place] [address] Ph.  [phone] Fax [phone]

## 2018-11-11 NOTE — Progress Notes (Signed)
Pt is not comfortable coming into a medical office due to the virus. She is toc from [employer]. Told that she will get a possible 30 day supply until there is recent lab for her. She  Is requesting the bp meds and the symbicort to be refilled. The pharmacy location [facility] in [place].

## 2018-11-19 ENCOUNTER — Other Ambulatory Visit: Payer: Self-pay | Admitting: Family Medicine

## 2018-11-20 ENCOUNTER — Telehealth: Payer: Self-pay | Admitting: Family Medicine

## 2018-11-20 NOTE — Progress Notes (Signed)
LVM to schedule appts

## 2018-11-20 NOTE — Telephone Encounter (Signed)
LVM to schedule lab and bp check nurse visit in the next few days, and schedule a 3 month f/u with [place]

## 2018-11-22 ENCOUNTER — Other Ambulatory Visit: Payer: Self-pay | Admitting: Physician Assistant

## 2018-11-22 DIAGNOSIS — I10 Essential (primary) hypertension: Secondary | ICD-10-CM

## 2018-11-24 ENCOUNTER — Other Ambulatory Visit: Payer: Self-pay

## 2018-11-24 ENCOUNTER — Encounter: Payer: BC Managed Care – PPO | Admitting: Family Medicine

## 2018-11-24 ENCOUNTER — Ambulatory Visit (INDEPENDENT_AMBULATORY_CARE_PROVIDER_SITE_OTHER): Payer: BC Managed Care – PPO | Admitting: Family Medicine

## 2018-11-24 DIAGNOSIS — E785 Hyperlipidemia, unspecified: Secondary | ICD-10-CM

## 2018-11-24 DIAGNOSIS — I10 Essential (primary) hypertension: Secondary | ICD-10-CM

## 2018-11-24 DIAGNOSIS — R7303 Prediabetes: Secondary | ICD-10-CM

## 2018-11-25 LAB — CBC
Hematocrit: 48.6 % — ABNORMAL HIGH (ref 34.0–46.6)
Hemoglobin: 15.9 g/dL (ref 11.1–15.9)
MCH: 28.3 pg (ref 26.6–33.0)
MCHC: 32.7 g/dL (ref 31.5–35.7)
MCV: 87 fL (ref 79–97)
Platelets: 199 10*3/uL (ref 150–450)
RBC: 5.61 x10E6/uL — ABNORMAL HIGH (ref 3.77–5.28)
RDW: 13.8 % (ref 11.7–15.4)
WBC: 7.3 10*3/uL (ref 3.4–10.8)

## 2018-11-25 LAB — COMPREHENSIVE METABOLIC PANEL
ALT: 29 IU/L (ref 0–32)
AST: 25 IU/L (ref 0–40)
Albumin/Globulin Ratio: 1.8 (ref 1.2–2.2)
Albumin: 4.8 g/dL (ref 3.8–4.9)
Alkaline Phosphatase: 21 IU/L — ABNORMAL LOW (ref 39–117)
BUN/Creatinine Ratio: 14 (ref 9–23)
BUN: 15 mg/dL (ref 6–24)
Bilirubin Total: 0.8 mg/dL (ref 0.0–1.2)
CO2: 22 mmol/L (ref 20–29)
Calcium: 10.1 mg/dL (ref 8.7–10.2)
Chloride: 95 mmol/L — ABNORMAL LOW (ref 96–106)
Creatinine, Ser: 1.05 mg/dL — ABNORMAL HIGH (ref 0.57–1.00)
GFR calc Af Amer: 71 mL/min/{1.73_m2} (ref >59)
GFR calc non Af Amer: 61 mL/min/{1.73_m2} (ref >59)
Globulin, Total: 2.7 g/dL (ref 1.5–4.5)
Glucose: 101 mg/dL — ABNORMAL HIGH (ref 65–99)
Potassium: 4.4 mmol/L (ref 3.5–5.2)
Sodium: 135 mmol/L (ref 134–144)
Total Protein: 7.5 g/dL (ref 6.0–8.5)

## 2018-11-25 LAB — HEMOGLOBIN A1C
Est. average glucose Bld gHb Est-mCnc: 137 mg/dL
Hgb A1c MFr Bld: 6.4 % — ABNORMAL HIGH (ref 4.8–5.6)

## 2018-11-25 LAB — LIPID PANEL
Chol/HDL Ratio: 6.2 ratio — ABNORMAL HIGH (ref 0.0–4.4)
Cholesterol, Total: 161 mg/dL (ref 100–199)
HDL: 26 mg/dL — ABNORMAL LOW (ref >39)
LDL Calculated: 112 mg/dL — ABNORMAL HIGH (ref 0–99)
Triglycerides: 114 mg/dL (ref 0–149)
VLDL Cholesterol Cal: 23 mg/dL (ref 5–40)

## 2018-11-25 LAB — TSH: TSH: 2.97 u[IU]/mL (ref 0.450–4.500)

## 2018-12-01 ENCOUNTER — Ambulatory Visit: Payer: BC Managed Care – PPO | Admitting: Family Medicine

## 2018-12-01 ENCOUNTER — Ambulatory Visit (INDEPENDENT_AMBULATORY_CARE_PROVIDER_SITE_OTHER): Payer: BC Managed Care – PPO | Admitting: Family Medicine

## 2018-12-01 ENCOUNTER — Other Ambulatory Visit: Payer: Self-pay

## 2018-12-01 DIAGNOSIS — Z23 Encounter for immunization: Secondary | ICD-10-CM

## 2018-12-12 ENCOUNTER — Other Ambulatory Visit: Payer: Self-pay

## 2018-12-12 ENCOUNTER — Other Ambulatory Visit (HOSPITAL_COMMUNITY)
Admission: RE | Admit: 2018-12-12 | Discharge: 2018-12-12 | Disposition: A | Discharge status: Home / Self Care | Payer: BC Managed Care – PPO | Source: Ambulatory Visit | Attending: Family Medicine | Admitting: Family Medicine

## 2018-12-12 ENCOUNTER — Encounter: Payer: Self-pay | Admitting: Family Medicine

## 2018-12-12 ENCOUNTER — Ambulatory Visit (INDEPENDENT_AMBULATORY_CARE_PROVIDER_SITE_OTHER): Payer: BC Managed Care – PPO | Admitting: Family Medicine

## 2018-12-12 VITALS — BP 138/82 | Temp 98.9°F | Ht 59.0 in | Wt 264.0 lb

## 2018-12-12 DIAGNOSIS — Z124 Encounter for screening for malignant neoplasm of cervix: Secondary | ICD-10-CM | POA: Diagnosis not present

## 2018-12-12 DIAGNOSIS — Z0000 Encounter for general adult medical examination without abnormal findings: Secondary | ICD-10-CM | POA: Diagnosis not present

## 2018-12-12 DIAGNOSIS — Z23 Encounter for immunization: Secondary | ICD-10-CM

## 2018-12-12 DIAGNOSIS — Z1151 Encounter for screening for human papillomavirus (HPV): Secondary | ICD-10-CM | POA: Diagnosis not present

## 2018-12-12 DIAGNOSIS — Z1211 Encounter for screening for malignant neoplasm of colon: Secondary | ICD-10-CM

## 2018-12-12 MED ORDER — NORETHINDRONE ACETATE 5 MG PO TABS: 5.0000 mg | ORAL_TABLET | Freq: Every day | ORAL | 11 refills | Status: DC

## 2018-12-12 NOTE — Progress Notes (Signed)
9/11/20208:21 AM  Debra Mendoza 1965-06-11, 53 y.o., female [id number]  Chief Complaint  Patient presents with  . Gynecologic Exam    HPI:   Patient is a 53 y.o. female with past medical history significant for HTN, asthma, HLP and prediabetes who presents today for WWE  Cervical Cancer Screening: done today, remote abnormal, last pap 3 years ago, h/o endometrial ablation, had breakthrough DUB, controlled now on norethindrone Breast Cancer Screening: aug 2020 Colorectal Cancer Screening: cologuard ordered today Bone Density Testing: at age 27  Most Recent Immunizations  Administered Date(s) Administered  . Influenza,inj,Quad PF,6+ Mos 12/01/2018  . Tdap 12/31/2011  zoster: today  Frequency of Dental evaluation: Q6 months Frequency of Eye evaluation: yearly  Depression screen [facility] 2/9 12/12/2018 11/11/2018 12/09/2017  Decreased Interest 0 0 0  Down, Depressed, Hopeless 0 0 0  PHQ - 2 Score 0 0 0    Fall Risk  12/12/2018 11/11/2018 12/09/2017 11/12/2017 10/07/2017  Falls in the past year? 0 0 No No No  Number falls in past yr: 0 0 - - -  Injury with Fall? 0 0 - - -     No Known Allergies  Prior to Admission medications   Medication Sig Start Date End Date Taking? Authorizing Provider  albuterol (VENTOLIN HFA) 108 (90 Base) MCG/ACT inhaler Inhale 2 puffs into the lungs every 6 (six) hours as needed for wheezing or shortness of breath. 11/11/18  Yes Rutherford Guys, MD  amLODipine (NORVASC) 10 MG tablet TAKE 1 TABLET BY MOUTH EVERY DAY 11/11/18  Yes Rutherford Guys, MD  Ascorbic Acid (VITAMIN C) 100 MG tablet Take 1,000 mg by mouth daily.    Yes [provider]  Biotin 5000 MCG CAPS Take by mouth.   Yes [provider]  Black Cohosh 40 MG CAPS Take 40 mg by mouth.   Yes [provider]  budesonide-formoterol (SYMBICORT) 80-4.5 MCG/ACT inhaler Inhale 2 puffs into the lungs daily. 11/11/18  Yes Rutherford Guys, MD  Calcium Carbonate-Vitamin D  (CALCIUM 600+D PO) Take 1 tablet by mouth daily.   Yes [provider]  losartan-hydrochlorothiazide (HYZAAR) 50-12.5 MG tablet Take 1 tablet by mouth daily. 11/11/18  Yes Rutherford Guys, MD  norethindrone (AYGESTIN) 5 MG tablet Take 5 mg by mouth daily.   Yes [provider]  potassium chloride (K-DUR) 10 MEQ tablet Take 1 tablet (10 mEq total) by mouth 2 (two) times daily. 11/11/18  Yes Rutherford Guys, MD    Past Medical History:  Diagnosis Date  . Hypertension     Past Surgical History:  Procedure Laterality Date  . IR RADIOLOGIST EVAL & MGMT  04/09/2017  . MYOMECTOMY    . NOVASURE ABLATION      Social History   Tobacco Use  . Smoking status: Never Smoker  . Smokeless tobacco: Never Used  Substance Use Topics  . Alcohol use: Yes    Comment: occassional     Family History  Problem Relation Age of Onset  . Hypertension Mother   . Hypertension Father   . Heart disease Father   . Hypertension Maternal Grandmother   . Breast cancer Paternal Aunt   . Breast cancer Maternal Aunt   . Colon cancer Neg Hx     Review of Systems  Constitutional: Negative for chills and fever.  Respiratory: Negative for cough and shortness of breath.   Cardiovascular: Negative for chest pain, palpitations and leg swelling.  Gastrointestinal: Negative for abdominal pain, nausea  and vomiting.  All other systems reviewed and are negative.    OBJECTIVE:  Today's Vitals   12/12/18 0816  BP: 138/82  Temp: 98.9 F (37.2 C)  SpO2: 96%  Weight: 264 lb (119.7 kg)  Height: 4\' 11"  (1.499 m)   Body mass index is 53.32 kg/m.   Physical Exam Vitals signs and nursing note reviewed. Exam conducted with a chaperone present.  Constitutional:      Appearance: She is well-developed.  HENT:     Head: Normocephalic and atraumatic.  Eyes:     General: No scleral icterus.    Conjunctiva/sclera: Conjunctivae normal.     Pupils: Pupils are equal, round, and reactive to light.   Neck:     Musculoskeletal: Neck supple.  Pulmonary:     Effort: Pulmonary effort is normal.  Genitourinary:    General: Normal vulva.     Vagina: Prolapsed vaginal walls present.     Cervix: No cervical motion tenderness, friability or lesion.     Uterus: Not fixed and not tender.      Adnexa:        Right: No tenderness or fullness.         Left: No tenderness or fullness.       Comments: Difficult pap due to anterior cervix and prolapsed vaginal walls Skin:    General: Skin is warm and dry.  Neurological:     Mental Status: She is alert and oriented to person, place, and time.     No results found for this or any previous visit (from the past 24 hour(s)).  No results found.   ASSESSMENT and PLAN  1. Encounter for Papanicolaou smear for cervical cancer screening - Cytology - PAP([facility])  2. Special screening for malignant neoplasms, colon - Cologuard  3. Need for vaccination  Other orders - norethindrone (AYGESTIN) 5 MG tablet; Take 1 tablet (5 mg total) by mouth daily. - Varicella-zoster vaccine IM  Return in about 6 months (around 06/11/2019) for HTN, HLP and prediabetes.    Rutherford Guys, MD Primary Care at [place], [place] [address] Ph.  [phone] Fax [phone]

## 2018-12-12 NOTE — Patient Instructions (Signed)
° ° ° °  If you have lab work done today you will be contacted with your lab results within the next 2 weeks.  If you have not heard from us then please contact us. The fastest way to get your results is to register for My Chart. ° ° °IF you received an x-ray today, you will receive an invoice from [facility]. Please contact [facility] at [phone] with questions or concerns regarding your invoice.  ° °IF you received labwork today, you will receive an invoice from [facility]. Please contact [facility] at [phone] with questions or concerns regarding your invoice.  ° °Our billing staff will not be able to assist you with questions regarding bills from these companies. ° °You will be contacted with the lab results as soon as they are available. The fastest way to get your results is to activate your My Chart account. Instructions are located on the last page of this paperwork. If you have not heard from us regarding the results in 2 weeks, please contact this office. °  ° ° ° °

## 2018-12-15 LAB — CYTOLOGY - PAP
Adequacy: ABSENT
Diagnosis: NEGATIVE
HPV: NOT DETECTED

## 2018-12-20 ENCOUNTER — Other Ambulatory Visit: Payer: Self-pay | Admitting: Family Medicine

## 2018-12-29 LAB — COLOGUARD: Cologuard: NEGATIVE

## 2019-01-06 ENCOUNTER — Other Ambulatory Visit: Payer: Self-pay | Admitting: Family Medicine

## 2019-01-22 ENCOUNTER — Ambulatory Visit: Payer: BC Managed Care – PPO | Admitting: Family Medicine

## 2019-02-03 ENCOUNTER — Other Ambulatory Visit: Payer: Self-pay | Admitting: Family Medicine

## 2019-02-03 DIAGNOSIS — I10 Essential (primary) hypertension: Secondary | ICD-10-CM

## 2019-02-12 ENCOUNTER — Other Ambulatory Visit: Payer: Self-pay

## 2019-02-12 ENCOUNTER — Ambulatory Visit (INDEPENDENT_AMBULATORY_CARE_PROVIDER_SITE_OTHER): Payer: BC Managed Care – PPO | Admitting: Family Medicine

## 2019-02-12 DIAGNOSIS — Z23 Encounter for immunization: Secondary | ICD-10-CM

## 2019-02-12 NOTE — Progress Notes (Signed)
Pt came in the office today for the 2nd shingle shot.--(Shingrix 0.68ml) Lt  Deltoid.

## 2019-03-17 ENCOUNTER — Other Ambulatory Visit: Payer: Self-pay | Admitting: Family Medicine

## 2019-03-26 ENCOUNTER — Other Ambulatory Visit: Payer: Self-pay

## 2019-03-26 ENCOUNTER — Ambulatory Visit: Payer: BC Managed Care – PPO | Admitting: Family Medicine

## 2019-03-26 ENCOUNTER — Encounter: Payer: Self-pay | Admitting: Family Medicine

## 2019-03-26 ENCOUNTER — Ambulatory Visit (INDEPENDENT_AMBULATORY_CARE_PROVIDER_SITE_OTHER): Payer: BC Managed Care – PPO

## 2019-03-26 VITALS — BP 132/80 | HR 96 | Temp 97.6°F | Ht 59.0 in | Wt 267.8 lb

## 2019-03-26 DIAGNOSIS — M545 Low back pain: Secondary | ICD-10-CM

## 2019-03-26 DIAGNOSIS — I10 Essential (primary) hypertension: Secondary | ICD-10-CM

## 2019-03-26 DIAGNOSIS — G8929 Other chronic pain: Secondary | ICD-10-CM

## 2019-03-26 LAB — POCT URINALYSIS DIP (MANUAL ENTRY)
Bilirubin, UA: NEGATIVE
Blood, UA: NEGATIVE
Glucose, UA: NEGATIVE mg/dL
Ketones, POC UA: NEGATIVE mg/dL
Leukocytes, UA: NEGATIVE
Nitrite, UA: NEGATIVE
Protein Ur, POC: NEGATIVE mg/dL
Spec Grav, UA: 1.015 (ref 1.010–1.025)
Urobilinogen, UA: 0.2 E.U./dL
pH, UA: 5 (ref 5.0–8.0)

## 2019-03-26 MED ORDER — MELOXICAM 15 MG PO TABS: 15.0000 mg | ORAL_TABLET | Freq: Every day | ORAL | 1 refills | Status: DC | PRN

## 2019-03-26 MED ORDER — LOSARTAN POTASSIUM-HCTZ 50-12.5 MG PO TABS: 1.0000 | ORAL_TABLET | Freq: Every day | ORAL | 1 refills | Status: DC

## 2019-03-26 MED ORDER — CYCLOBENZAPRINE HCL 10 MG PO TABS: 10.0000 mg | ORAL_TABLET | Freq: Every evening | ORAL | 1 refills | Status: DC | PRN

## 2019-03-26 NOTE — Progress Notes (Signed)
12/24/20209:14 AM  Debra Mendoza 07-26-1965, 53 y.o., female [id number]  Chief Complaint  Patient presents with  . Back Pain    patient states she has been having lower back pain for atleast 3 months now.    HPI:   Patient is a 53 y.o. female with past medical history significant for , asthma, HLP and prediabetes who presents today for chronic low back pain  Has been having low back pain, across her waist line Sometimes feels like muscle spasms Some days so intense, needs to rest after 5 minutes or so of standing,  Has needed to take at least 800mg  ibu or 1000mg  APAP Worse with walking Better with sitting up right or lying down straight Does not go down her legs, no numbness or tingling except of left lateral distal thigh No changes to bowel or bladder function Thinks it is related to weight gain  Takes her BP med at night, took last night  Pelvic mri 2019 - Markedly enlarged uterus with diffuse involvement by fibroids. No intracavitary or pedunculated fibroids identified  Depression screen [facility] 2/9 03/26/2019 12/12/2018 11/11/2018  Decreased Interest 0 0 0  Down, Depressed, Hopeless 0 0 0  PHQ - 2 Score 0 0 0    Fall Risk  03/26/2019 12/12/2018 11/11/2018 12/09/2017 11/12/2017  Falls in the past year? 0 0 0 No No  Number falls in past yr: 0 0 0 - -  Injury with Fall? 0 0 0 - -  Follow up Falls evaluation completed - - - -     No Known Allergies  Prior to Admission medications   Medication Sig Start Date End Date Taking? Authorizing Provider  albuterol (VENTOLIN HFA) 108 (90 Base) MCG/ACT inhaler Inhale 2 puffs into the lungs every 6 (six) hours as needed for wheezing or shortness of breath. 11/11/18  Yes Rutherford Guys, MD  amLODipine (NORVASC) 10 MG tablet TAKE 1 TABLET BY MOUTH EVERY DAY 02/03/19  Yes Rutherford Guys, MD  Ascorbic Acid (VITAMIN C) 100 MG tablet Take 1,000 mg by mouth daily.    Yes [provider]  Biotin 5000 MCG CAPS Take by mouth.    Yes [provider]  Black Cohosh 40 MG CAPS Take 40 mg by mouth.   Yes [provider]  budesonide-formoterol (SYMBICORT) 80-4.5 MCG/ACT inhaler Inhale 2 puffs into the lungs daily. 11/11/18  Yes Rutherford Guys, MD  Calcium Carbonate-Vitamin D (CALCIUM 600+D PO) Take 1 tablet by mouth daily.   Yes [provider]  KLOR-CON M10 10 MEQ tablet TAKE 1 TABLET (10 MEQ TOTAL) BY MOUTH 2 (TWO) TIMES DAILY. 03/17/19  Yes Rutherford Guys, MD  losartan-hydrochlorothiazide [facility]) 50-12.5 MG tablet TAKE 1 TABLET BY MOUTH EVERY DAY 02/03/19  Yes Rutherford Guys, MD  norethindrone (AYGESTIN) 5 MG tablet Take 1 tablet (5 mg total) by mouth daily. 12/12/18  Yes Rutherford Guys, MD    Past Medical History:  Diagnosis Date  . Hypertension     Past Surgical History:  Procedure Laterality Date  . IR RADIOLOGIST EVAL & MGMT  04/09/2017  . MYOMECTOMY    . NOVASURE ABLATION      Social History   Tobacco Use  . Smoking status: Never Smoker  . Smokeless tobacco: Never Used  Substance Use Topics  . Alcohol use: Yes    Comment: occassional     Family History  Problem Relation Age of Onset  . Hypertension Mother   . Hypertension Father   .  Heart disease Father   . Hypertension Maternal Grandmother   . Breast cancer Paternal Aunt   . Breast cancer Maternal Aunt   . Colon cancer Neg Hx     Review of Systems  Constitutional: Negative for chills and fever.  Respiratory: Negative for cough and shortness of breath.   Cardiovascular: Negative for chest pain, palpitations and leg swelling.  Gastrointestinal: Negative for abdominal pain, nausea and vomiting.   Per hpi  OBJECTIVE:  Today's Vitals   03/26/19 0832 03/26/19 0920  BP: (!) 153/76 132/80  Pulse: 96   Temp: 97.6 F (36.4 C)   TempSrc: Temporal   SpO2: 96%   Weight: 267 lb 12.8 oz (121.5 kg)   Height: 4\' 11"  (1.499 m)    Body mass index is 54.09 kg/m.  BP Readings from Last 3 Encounters:  03/26/19  (!) 153/76  12/12/18 138/82  12/18/17 136/82     Physical Exam Vitals and nursing note reviewed.  Constitutional:      Appearance: She is well-developed.  HENT:     Head: Normocephalic and atraumatic.     Mouth/Throat:     Pharynx: No oropharyngeal exudate.  Eyes:     General: No scleral icterus.    Conjunctiva/sclera: Conjunctivae normal.     Pupils: Pupils are equal, round, and reactive to light.  Cardiovascular:     Rate and Rhythm: Normal rate and regular rhythm.     Heart sounds: Normal heart sounds. No murmur. No friction rub. No gallop.   Pulmonary:     Effort: Pulmonary effort is normal.     Breath sounds: Normal breath sounds. No wheezing or rales.  Musculoskeletal:     Cervical back: Neck supple.       Back:     Right lower leg: No edema.     Left lower leg: No edema.  Skin:    General: Skin is warm and dry.  Neurological:     Mental Status: She is alert and oriented to person, place, and time.     Motor: No weakness.     Gait: Gait is intact.     Deep Tendon Reflexes: Reflexes are normal and symmetric.     Results for orders placed or performed in visit on 03/26/19 (from the past 24 hour(s))  POCT urinalysis dipstick     Status: None   Collection Time: 03/26/19  9:00 AM  Result Value Ref Range   Color, UA yellow yellow   Clarity, UA clear clear   Glucose, UA negative negative mg/dL   Bilirubin, UA negative negative   Ketones, POC UA negative negative mg/dL   Spec Grav, UA 1.015 1.010 - 1.025   Blood, UA negative negative   pH, UA 5.0 5.0 - 8.0   Protein Ur, POC negative negative mg/dL   Urobilinogen, UA 0.2 0.2 or 1.0 E.U./dL   Nitrite, UA Negative Negative   Leukocytes, UA Negative Negative    No results found.   ASSESSMENT and PLAN  1. Chronic bilateral low back pain without sciatica Discussed supportive measures, new meds r/se/b and RTC precautions. Patient educational handout given. Consider PT - POCT urinalysis dipstick - DG Lumbar  Spine 2-3 Views; Future  2. Essential hypertension, benign Controlled. Continue current regime.   Other orders - losartan-hydrochlorothiazide (HYZAAR) 50-12.5 MG tablet; Take 1 tablet by mouth daily. - cyclobenzaprine (FLEXERIL) 10 MG tablet; Take 1 tablet (10 mg total) by mouth at bedtime as needed for muscle spasms. - meloxicam (MOBIC) 15 MG tablet;  Take 1 tablet (15 mg total) by mouth daily as needed for pain.  Return in about 3 months (around 06/24/2019).    Rutherford Guys, MD Primary Care at [place], [place] [address] Ph.  [phone] Fax [phone]

## 2019-03-26 NOTE — Patient Instructions (Addendum)
If you have lab work done today you will be contacted with your lab results within the next 2 weeks.  If you have not heard from [place] then please contact us. The fastest way to get your results is to register for My Chart.   IF you received an x-ray today, you will receive an invoice from [facility]. Please contact [facility] at [phone] with questions or concerns regarding your invoice.   IF you received labwork today, you will receive an invoice from [facility]. Please contact [facility] at [phone] with questions or concerns regarding your invoice.   Our billing staff will not be able to assist you with questions regarding bills from these companies.  You will be contacted with the lab results as soon as they are available. The fastest way to get your results is to activate your My Chart account. Instructions are located on the last page of this paperwork. If you have not heard from [place] regarding the results in 2 weeks, please contact this office.     Low Back Sprain or Strain Rehab Ask your health care provider which exercises are safe for you. Do exercises exactly as told by your health care provider and adjust them as directed. It is normal to feel mild stretching, pulling, tightness, or discomfort as you do these exercises. Stop right away if you feel sudden pain or your pain gets worse. Do not begin these exercises until told by your health care provider. Stretching and range-of-motion exercises These exercises warm up your muscles and joints and improve the movement and flexibility of your back. These exercises also help to relieve pain, numbness, and tingling. Lumbar rotation  1. Lie on your back on a firm surface and bend your knees. 2. Straighten your arms out to your sides so each arm forms a 90-degree angle (right angle) with a side of your body. 3. Slowly move (rotate) both of your knees to one side of your body until you feel a stretch in your lower  back (lumbar). Try not to let your shoulders lift off the floor. 4. Hold this position for __________ seconds. 5. Tense your abdominal muscles and slowly move your knees back to the starting position. 6. Repeat this exercise on the other side of your body. Repeat __________ times. Complete this exercise __________ times a day. Single knee to chest  1. Lie on your back on a firm surface with both legs straight. 2. Bend one of your knees. Use your hands to move your knee up toward your chest until you feel a gentle stretch in your lower back and buttock. ? Hold your leg in this position by holding on to the front of your knee. ? Keep your other leg as straight as possible. 3. Hold this position for __________ seconds. 4. Slowly return to the starting position. 5. Repeat with your other leg. Repeat __________ times. Complete this exercise __________ times a day. Prone extension on elbows  1. Lie on your abdomen on a firm surface (prone position). 2. Prop yourself up on your elbows. 3. Use your arms to help lift your chest up until you feel a gentle stretch in your abdomen and your lower back. ? This will place some of your body weight on your elbows. If this is uncomfortable, try stacking pillows under your chest. ? Your hips should stay down, against the surface that you are lying on. Keep your hip and back muscles relaxed. 4. Hold this position for __________  seconds. 5. Slowly relax your upper body and return to the starting position. Repeat __________ times. Complete this exercise __________ times a day. Strengthening exercises These exercises build strength and endurance in your back. Endurance is the ability to use your muscles for a long time, even after they get tired. Pelvic tilt This exercise strengthens the muscles that lie deep in the abdomen. 1. Lie on your back on a firm surface. Bend your knees and keep your feet flat on the floor. 2. Tense your abdominal muscles. Tip your  pelvis up toward the ceiling and flatten your lower back into the floor. ? To help with this exercise, you may place a small towel under your lower back and try to push your back into the towel. 3. Hold this position for __________ seconds. 4. Let your muscles relax completely before you repeat this exercise. Repeat __________ times. Complete this exercise __________ times a day. Alternating arm and leg raises  1. Get on your hands and knees on a firm surface. If you are on a hard floor, you may want to use padding, such as an exercise mat, to cushion your knees. 2. Line up your arms and legs. Your hands should be directly below your shoulders, and your knees should be directly below your hips. 3. Lift your left leg behind you. At the same time, raise your right arm and straighten it in front of you. ? Do not lift your leg higher than your hip. ? Do not lift your arm higher than your shoulder. ? Keep your abdominal and back muscles tight. ? Keep your hips facing the ground. ? Do not arch your back. ? Keep your balance carefully, and do not hold your breath. 4. Hold this position for __________ seconds. 5. Slowly return to the starting position. 6. Repeat with your right leg and your left arm. Repeat __________ times. Complete this exercise __________ times a day. Abdominal set with straight leg raise  1. Lie on your back on a firm surface. 2. Bend one of your knees and keep your other leg straight. 3. Tense your abdominal muscles and lift your straight leg up, 4-6 inches (10-15 cm) off the ground. 4. Keep your abdominal muscles tight and hold this position for __________ seconds. ? Do not hold your breath. ? Do not arch your back. Keep it flat against the ground. 5. Keep your abdominal muscles tense as you slowly lower your leg back to the starting position. 6. Repeat with your other leg. Repeat __________ times. Complete this exercise __________ times a day. Single leg lower with bent  knees 1. Lie on your back on a firm surface. 2. Tense your abdominal muscles and lift your feet off the floor, one foot at a time, so your knees and hips are bent in 90-degree angles (right angles). ? Your knees should be over your hips and your lower legs should be parallel to the floor. 3. Keeping your abdominal muscles tense and your knee bent, slowly lower one of your legs so your toe touches the ground. 4. Lift your leg back up to return to the starting position. ? Do not hold your breath. ? Do not let your back arch. Keep your back flat against the ground. 5. Repeat with your other leg. Repeat __________ times. Complete this exercise __________ times a day. Posture and body mechanics Good posture and healthy body mechanics can help to relieve stress in your body's tissues and joints. Body mechanics refers to the movements and  positions of your body while you do your daily activities. Posture is part of body mechanics. Good posture means:  Your spine is in its natural S-curve position (neutral).  Your shoulders are pulled back slightly.  Your head is not tipped forward. Follow these guidelines to improve your posture and body mechanics in your everyday activities. Standing   When standing, keep your spine neutral and your feet about hip width apart. Keep a slight bend in your knees. Your ears, shoulders, and hips should line up.  When you do a task in which you stand in one place for a long time, place one foot up on a stable object that is 2-4 inches (5-10 cm) high, such as a footstool. This helps keep your spine neutral. Sitting   When sitting, keep your spine neutral and keep your feet flat on the floor. Use a footrest, if necessary, and keep your thighs parallel to the floor. Avoid rounding your shoulders, and avoid tilting your head forward.  When working at a desk or a computer, keep your desk at a height where your hands are slightly lower than your elbows. Slide your chair  under your desk so you are close enough to maintain good posture.  When working at a computer, place your monitor at a height where you are looking straight ahead and you do not have to tilt your head forward or downward to look at the screen. Resting  When lying down and resting, avoid positions that are most painful for you.  If you have pain with activities such as sitting, bending, stooping, or squatting, lie in a position in which your body does not bend very much. For example, avoid curling up on your side with your arms and knees near your chest (fetal position).  If you have pain with activities such as standing for a long time or reaching with your arms, lie with your spine in a neutral position and bend your knees slightly. Try the following positions: ? Lying on your side with a pillow between your knees. ? Lying on your back with a pillow under your knees. Lifting   When lifting objects, keep your feet at least shoulder width apart and tighten your abdominal muscles.  Bend your knees and hips and keep your spine neutral. It is important to lift using the strength of your legs, not your back. Do not lock your knees straight out.  Always ask for help to lift heavy or awkward objects. This information is not intended to replace advice given to you by your health care provider. Make sure you discuss any questions you have with your health care provider. Document Released: 03/19/2005 Document Revised: 07/11/2018 Document Reviewed: 04/10/2018 Elsevier Patient Education  2020 [employer].

## 2019-03-30 ENCOUNTER — Other Ambulatory Visit: Payer: Self-pay | Admitting: Family Medicine

## 2019-04-07 ENCOUNTER — Other Ambulatory Visit: Payer: Self-pay | Admitting: Family Medicine

## 2019-04-14 ENCOUNTER — Encounter: Payer: Self-pay | Admitting: Family Medicine

## 2019-04-23 ENCOUNTER — Encounter: Payer: Self-pay | Admitting: Family Medicine

## 2019-04-24 ENCOUNTER — Telehealth: Payer: Self-pay

## 2019-04-24 NOTE — Telephone Encounter (Signed)
FMLA forms given to doctor for completion

## 2019-05-02 ENCOUNTER — Other Ambulatory Visit: Payer: Self-pay | Admitting: Family Medicine

## 2019-05-02 DIAGNOSIS — I10 Essential (primary) hypertension: Secondary | ICD-10-CM

## 2019-05-12 ENCOUNTER — Other Ambulatory Visit: Payer: Self-pay | Admitting: Family Medicine

## 2019-05-12 NOTE — Telephone Encounter (Signed)
Requested Prescriptions  Pending Prescriptions Disp Refills  . KLOR-CON M10 10 MEQ tablet [Pharmacy Med Name: KLOR-CON M10 TABLET] 30 tablet 2    Sig: TAKE 1 TABLET (10 MEQ TOTAL) BY MOUTH 2 (TWO) TIMES DAILY.     Endocrinology:  Minerals - Potassium Supplementation Failed - 05/12/2019  2:33 PM      Failed - Cr in normal range and within 360 days    Creat  Date Value Ref Range Status  11/07/2015 0.88 0.50 - 1.10 mg/dL Final   Creatinine, Ser  Date Value Ref Range Status  11/24/2018 1.05 (H) 0.57 - 1.00 mg/dL Final         Passed - K in normal range and within 360 days    Potassium  Date Value Ref Range Status  11/24/2018 4.4 3.5 - 5.2 mmol/L Final         Passed - Valid encounter within last 12 months    Recent Outpatient Visits          1 month ago Chronic bilateral low back pain without sciatica   [facility], Lilia Argue, MD   2 months ago Need for prophylactic vaccination against single diseases   [facility], Lilia Argue, MD   5 months ago Encounter for Papanicolaou smear for cervical cancer screening   [facility], Lilia Argue, MD   5 months ago Need for influenza vaccination   [facility], Lilia Argue, MD   5 months ago Prediabetes   [facility], Lilia Argue, MD      Future Appointments            In 1 month Rutherford Guys, MD Primary Care at [place], [facility]

## 2019-05-19 ENCOUNTER — Other Ambulatory Visit: Payer: Self-pay | Admitting: Family Medicine

## 2019-05-20 ENCOUNTER — Other Ambulatory Visit: Payer: Self-pay | Admitting: Family Medicine

## 2019-05-20 NOTE — Telephone Encounter (Signed)
Requested medication (s) are due for refill today: yes  Requested medication (s) are on the active medication list: yes  Last refill: 05/13/19  Future visit scheduled: no  Notes to clinic:  is pt to continue this medication    Requested Prescriptions  Pending Prescriptions Disp Refills   KLOR-CON M10 10 MEQ tablet [Pharmacy Med Name: KLOR-CON M10 TABLET] 30 tablet 2    Sig: TAKE 1 TABLET (10 MEQ TOTAL) BY MOUTH 2 (TWO) TIMES DAILY.      Endocrinology:  Minerals - Potassium Supplementation Failed - 05/20/2019  8:30 AM      Failed - Cr in normal range and within 360 days    Creat  Date Value Ref Range Status  11/07/2015 0.88 0.50 - 1.10 mg/dL Final   Creatinine, Ser  Date Value Ref Range Status  11/24/2018 1.05 (H) 0.57 - 1.00 mg/dL Final          Passed - K in normal range and within 360 days    Potassium  Date Value Ref Range Status  11/24/2018 4.4 3.5 - 5.2 mmol/L Final          Passed - Valid encounter within last 12 months    Recent Outpatient Visits           1 month ago Chronic bilateral low back pain without sciatica   [facility], Lilia Argue, MD   3 months ago Need for prophylactic vaccination against single diseases   [facility], Lilia Argue, MD   5 months ago Encounter for Papanicolaou smear for cervical cancer screening   [facility], Lilia Argue, MD   5 months ago Need for influenza vaccination   [facility], Lilia Argue, MD   5 months ago Prediabetes   [facility], Lilia Argue, MD       Future Appointments             In 3 weeks Rutherford Guys, MD [facility] at [place], [facility]

## 2019-05-26 ENCOUNTER — Encounter: Payer: Self-pay | Admitting: Family Medicine

## 2019-05-31 ENCOUNTER — Ambulatory Visit: Payer: BC Managed Care – PPO

## 2019-06-02 ENCOUNTER — Telehealth: Payer: Self-pay | Admitting: Family Medicine

## 2019-06-02 NOTE — Telephone Encounter (Signed)
Pt called regarding her out of work letter she received from provider. Pt is wanting another letter because her old letter runs out on 06/04/19 and needs new letter by 06/05/19 pt would like a call [phone] Please advise.

## 2019-06-02 NOTE — Telephone Encounter (Signed)
error 

## 2019-06-02 NOTE — Telephone Encounter (Signed)
Work [employer], operational approved by [employer] and sent to patient via my-chart.

## 2019-06-04 ENCOUNTER — Other Ambulatory Visit: Payer: Self-pay | Admitting: Family Medicine

## 2019-06-04 DIAGNOSIS — J45909 Unspecified asthma, uncomplicated: Secondary | ICD-10-CM

## 2019-06-04 NOTE — Telephone Encounter (Signed)
Requested Prescriptions  Pending Prescriptions Disp Refills  . SYMBICORT 80-4.5 MCG/ACT inhaler [Pharmacy Med Name: SYMBICORT 80-4.5 MCG INHALER] 30.6 Inhaler 1    Sig: INHALE 2 PUFFS BY MOUTH INTO THE LUNGS DAILY     Pulmonology:  Combination Products Passed - 06/04/2019  9:31 AM      Passed - Valid encounter within last 12 months    Recent Outpatient Visits          2 months ago Chronic bilateral low back pain without sciatica   [facility], Lilia Argue, MD   3 months ago Need for prophylactic vaccination against single diseases   [facility], Lilia Argue, MD   5 months ago Encounter for Papanicolaou smear for cervical cancer screening   [facility], Lilia Argue, MD   6 months ago Need for influenza vaccination   [facility], Lilia Argue, MD   6 months ago Prediabetes   [facility], Lilia Argue, MD      Future Appointments            In 1 week Rutherford Guys, MD Primary Care at [place], [facility]

## 2019-06-11 ENCOUNTER — Ambulatory Visit: Payer: BC Managed Care – PPO | Admitting: Family Medicine

## 2019-07-25 ENCOUNTER — Other Ambulatory Visit: Payer: Self-pay | Admitting: Family Medicine

## 2019-07-25 NOTE — Telephone Encounter (Signed)
Requested Prescriptions  Pending Prescriptions Disp Refills  . KLOR-CON M10 10 MEQ tablet [Pharmacy Med Name: KLOR-CON M10 TABLET] 180 tablet 1    Sig: TAKE 1 TABLET (10 MEQ TOTAL) BY MOUTH 2 (TWO) TIMES DAILY.     Endocrinology:  Minerals - Potassium Supplementation Failed - 07/25/2019  2:33 PM      Failed - Cr in normal range and within 360 days    Creat  Date Value Ref Range Status  11/07/2015 0.88 0.50 - 1.10 mg/dL Final   Creatinine, Ser  Date Value Ref Range Status  11/24/2018 1.05 (H) 0.57 - 1.00 mg/dL Final         Passed - K in normal range and within 360 days    Potassium  Date Value Ref Range Status  11/24/2018 4.4 3.5 - 5.2 mmol/L Final         Passed - Valid encounter within last 12 months    Recent Outpatient Visits          4 months ago Chronic bilateral low back pain without sciatica   [facility], Lilia Argue, MD   5 months ago Need for prophylactic vaccination against single diseases   [facility], Lilia Argue, MD   7 months ago Encounter for Papanicolaou smear for cervical cancer screening   [facility], Lilia Argue, MD   7 months ago Need for influenza vaccination   [facility], Lilia Argue, MD   8 months ago Prediabetes   [facility], Lilia Argue, MD

## 2019-07-27 ENCOUNTER — Other Ambulatory Visit: Payer: Self-pay | Admitting: Family Medicine

## 2019-07-27 DIAGNOSIS — I10 Essential (primary) hypertension: Secondary | ICD-10-CM

## 2019-10-10 ENCOUNTER — Other Ambulatory Visit: Payer: Self-pay | Admitting: Family Medicine

## 2019-10-10 NOTE — Telephone Encounter (Signed)
30 day courtesy RF- advise pt to call office to make appt for further refills Requested Prescriptions  Pending Prescriptions Disp Refills  . losartan-hydrochlorothiazide (HYZAAR) 50-12.5 MG tablet [Pharmacy Med Name: LOSARTAN-HCTZ 50-12.5 MG TAB] 30 tablet 0    Sig: TAKE 1 TABLET BY MOUTH EVERY DAY     Cardiovascular: ARB + Diuretic Combos Failed - 10/10/2019  9:05 AM      Failed - K in normal range and within 180 days    Potassium  Date Value Ref Range Status  11/24/2018 4.4 3.5 - 5.2 mmol/L Final         Failed - Na in normal range and within 180 days    Sodium  Date Value Ref Range Status  11/24/2018 135 134 - 144 mmol/L Final         Failed - Cr in normal range and within 180 days    Creat  Date Value Ref Range Status  11/07/2015 0.88 0.50 - 1.10 mg/dL Final   Creatinine, Ser  Date Value Ref Range Status  11/24/2018 1.05 (H) 0.57 - 1.00 mg/dL Final         Failed - Ca in normal range and within 180 days    Calcium  Date Value Ref Range Status  11/24/2018 10.1 8.7 - 10.2 mg/dL Final         Failed - Valid encounter within last 6 months    Recent Outpatient Visits          6 months ago Chronic bilateral low back pain without sciatica   [facility], Lilia Argue, MD   8 months ago Need for prophylactic vaccination against single diseases   [facility], Lilia Argue, MD   10 months ago Encounter for Papanicolaou smear for cervical cancer screening   [facility], Lilia Argue, MD   10 months ago Need for influenza vaccination   [facility], Lilia Argue, MD   10 months ago Prediabetes   [facility], Lilia Argue, MD             Passed - Patient is not pregnant      Passed - Last BP in normal range    BP Readings from Last 1 Encounters:  03/26/19 132/80

## 2019-10-24 ENCOUNTER — Other Ambulatory Visit: Payer: Self-pay | Admitting: Family Medicine

## 2019-10-24 NOTE — Telephone Encounter (Signed)
Requested medication (s) are due for refill today: yes  Requested medication (s) are on the active medication list: yes  Last refill:  10/10/19 30 day courtesy RF  Future visit scheduled: no  Notes to clinic:  pt needs appt ([facility] no longer makes appts for this practice)/overdue lab work   Requested Prescriptions  Pending Prescriptions Disp Refills   losartan-hydrochlorothiazide (HYZAAR) 50-12.5 MG tablet [Pharmacy Med Name: LOSARTAN-HCTZ 50-12.5 MG TAB] 90 tablet 1    Sig: TAKE 1 TABLET BY MOUTH EVERY DAY      Cardiovascular: ARB + Diuretic Combos Failed - 10/24/2019 10:31 AM      Failed - K in normal range and within 180 days    Potassium  Date Value Ref Range Status  11/24/2018 4.4 3.5 - 5.2 mmol/L Final          Failed - Na in normal range and within 180 days    Sodium  Date Value Ref Range Status  11/24/2018 135 134 - 144 mmol/L Final          Failed - Cr in normal range and within 180 days    Creat  Date Value Ref Range Status  11/07/2015 0.88 0.50 - 1.10 mg/dL Final   Creatinine, Ser  Date Value Ref Range Status  11/24/2018 1.05 (H) 0.57 - 1.00 mg/dL Final          Failed - Ca in normal range and within 180 days    Calcium  Date Value Ref Range Status  11/24/2018 10.1 8.7 - 10.2 mg/dL Final          Failed - Valid encounter within last 6 months    Recent Outpatient Visits           7 months ago Chronic bilateral low back pain without sciatica   [facility], Lilia Argue, MD   8 months ago Need for prophylactic vaccination against single diseases   [facility], Lilia Argue, MD   10 months ago Encounter for Papanicolaou smear for cervical cancer screening   [facility], Lilia Argue, MD   10 months ago Need for influenza vaccination   [facility], Lilia Argue, MD   11 months ago Prediabetes   [facility], Lilia Argue, MD              Passed - Patient is not pregnant       Passed - Last BP in normal range    BP Readings from Last 1 Encounters:  03/26/19 132/80

## 2019-10-26 NOTE — Telephone Encounter (Signed)
Pt needs to be seen in order to get additional refills.   It looks like curtsy has been given one time already. Pt has not been seen since 03/2019. Once appointment is scheduled pt is able to get another refill of the medication.   Please assist in scheduling the F/u visit for BP check and see how she is doing.

## 2019-10-26 NOTE — Telephone Encounter (Signed)
10/26/2019 - WHEN I CALLED THE PATIENT SHE STATES HER PHARMACY REQUESTED A REFILL ON HER LOSARTAN-HCTZ. SHE SAID SHE HAS A 1 MONTH SUPPLY LEFT TO USE. I HAVE SCHEDULED HER FOR A BLOOD PRESSURE CHECK OFFICE VISIT ON Friday 11/13/2019 AT 8:00 am WITH DR. Benay Spice. I WILL NOT ROUTE THIS MESSAGE BACK TO THE CLINICAL TEAM DUE TO PATIENT HAVING ENOUGH MEDICINE TO LAST UNTIL HER APPOINTMENT. [place]

## 2019-11-13 ENCOUNTER — Encounter: Payer: Self-pay | Admitting: Family Medicine

## 2019-11-13 ENCOUNTER — Ambulatory Visit: Payer: BC Managed Care – PPO | Admitting: Family Medicine

## 2019-11-13 ENCOUNTER — Other Ambulatory Visit: Payer: Self-pay

## 2019-11-13 VITALS — BP 160/98 | HR 102 | Temp 98.7°F | Ht 59.0 in | Wt 264.6 lb

## 2019-11-13 DIAGNOSIS — J45909 Unspecified asthma, uncomplicated: Secondary | ICD-10-CM | POA: Diagnosis not present

## 2019-11-13 DIAGNOSIS — R7303 Prediabetes: Secondary | ICD-10-CM | POA: Diagnosis not present

## 2019-11-13 DIAGNOSIS — N938 Other specified abnormal uterine and vaginal bleeding: Secondary | ICD-10-CM

## 2019-11-13 DIAGNOSIS — E785 Hyperlipidemia, unspecified: Secondary | ICD-10-CM

## 2019-11-13 DIAGNOSIS — I10 Essential (primary) hypertension: Secondary | ICD-10-CM | POA: Diagnosis not present

## 2019-11-13 DIAGNOSIS — F4321 Adjustment disorder with depressed mood: Secondary | ICD-10-CM

## 2019-11-13 MED ORDER — POTASSIUM CHLORIDE CRYS ER 10 MEQ PO TBCR: EXTENDED_RELEASE_TABLET | ORAL | 1 refills | Status: DC

## 2019-11-13 MED ORDER — AMLODIPINE BESYLATE 10 MG PO TABS: 10.0000 mg | ORAL_TABLET | Freq: Every day | ORAL | 1 refills | Status: DC

## 2019-11-13 MED ORDER — BUDESONIDE-FORMOTEROL FUMARATE 80-4.5 MCG/ACT IN AERO: INHALATION_SPRAY | RESPIRATORY_TRACT | 5 refills | Status: DC

## 2019-11-13 MED ORDER — ALBUTEROL SULFATE HFA 108 (90 BASE) MCG/ACT IN AERS: 2.0000 | INHALATION_SPRAY | Freq: Four times a day (QID) | RESPIRATORY_TRACT | 3 refills | Status: AC | PRN

## 2019-11-13 MED ORDER — LOSARTAN POTASSIUM-HCTZ 100-12.5 MG PO TABS: 1.0000 | ORAL_TABLET | Freq: Every day | ORAL | 1 refills | Status: DC

## 2019-11-13 NOTE — Progress Notes (Signed)
8/13/20218:19 AM  Debra Mendoza 1965-10-06, 54 y.o., female [id number]  Chief Complaint  Patient presents with  . Hypertension    f/u and med refill     HPI:   Patient is a 54 y.o. female with past medical history significant for HTN, HLP, prediabetes, asthma who presents today for routine followup  Last OV dec 2020  She is overall doing ok grieving death of her brother, he died a month ago Tends to check BP at home 140/80 Takes her amlodipine, losartan/hctz at night Takes potassium supplement She took them last night  She reports that the heat has been affecting her asthma She has been having intermittent wheezing and SOB only when she is out in the heat, using her albuterol, which resolves symptoms x couple of weeks Denies chest tightness  She had been working on [employer] what she eats Not walking as much due to heat  She declines counseling for grief Reports has good support system phq9 ang gad7 noted   She has continued to take norethindrone which was started for DUB She has had no bleeding for over a year She reports hot flashes - takes black cohash She is scheduled for mammo next week  Fasting today  Wt Readings from Last 3 Encounters:  11/13/19 264 lb 9.6 oz (120 kg)  03/26/19 267 lb 12.8 oz (121.5 kg)  12/12/18 264 lb (119.7 kg)     Depression screen [facility] 2/9 11/13/2019 03/26/2019 12/12/2018  Decreased Interest 2 0 0  Down, Depressed, Hopeless 2 0 0  PHQ - 2 Score 4 0 0  Altered sleeping 3 - -  Tired, decreased energy 2 - -  Change in appetite 2 - -  Feeling bad or failure about yourself  1 - -  Trouble concentrating 2 - -  Moving slowly or fidgety/restless 0 - -  Suicidal thoughts 0 - -  PHQ-9 Score 14 - -  Difficult doing work/chores Somewhat difficult - -   GAD 7 : Generalized Anxiety Score 11/13/2019  Nervous, Anxious, on Edge 2  Control/stop worrying 2  Worry too much - different things 2  Trouble relaxing 2  Restless 1    Easily annoyed or irritable 1  Afraid - awful might happen 2  Total GAD 7 Score 12  Anxiety Difficulty Somewhat difficult     Fall Risk  11/13/2019 03/26/2019 12/12/2018 11/11/2018 12/09/2017  Falls in the past year? 0 0 0 0 No  Number falls in past yr: 0 0 0 0 -  Injury with Fall? 0 0 0 0 -  Follow up Falls evaluation completed Falls evaluation completed - - -     No Known Allergies  Prior to Admission medications   Medication Sig Start Date End Date Taking? Authorizing Provider  albuterol (VENTOLIN HFA) 108 (90 Base) MCG/ACT inhaler Inhale 2 puffs into the lungs every 6 (six) hours as needed for wheezing or shortness of breath. 11/11/18  Yes Rutherford Guys, MD  amLODipine (NORVASC) 10 MG tablet TAKE 1 TABLET BY MOUTH EVERY DAY 07/27/19  Yes Rutherford Guys, MD  Ascorbic Acid (VITAMIN C) 100 MG tablet Take 1,000 mg by mouth daily.    Yes [provider]  Biotin 5000 MCG CAPS Take by mouth.   Yes [provider]  Black Cohosh 40 MG CAPS Take 40 mg by mouth.   Yes [provider]  Calcium Carbonate-Vitamin D (CALCIUM 600+D PO) Take 1 tablet by mouth daily.   Yes [provider]  cyclobenzaprine (FLEXERIL) 10 MG tablet Take 1 tablet (10 mg total) by mouth at bedtime as needed for muscle spasms. 03/26/19  Yes Rutherford Guys, MD  KLOR-CON M10 10 MEQ tablet TAKE 1 TABLET (10 MEQ TOTAL) BY MOUTH 2 (TWO) TIMES DAILY. 07/25/19  Yes Rutherford Guys, MD  losartan-hydrochlorothiazide [facility]) 50-12.5 MG tablet TAKE 1 TABLET BY MOUTH EVERY DAY 10/10/19  Yes Rutherford Guys, MD  meloxicam (MOBIC) 15 MG tablet TAKE 1 TABLET BY MOUTH EVERY DAY AS NEEDED FOR PAIN 05/19/19  Yes Rutherford Guys, MD  norethindrone (AYGESTIN) 5 MG tablet Take 1 tablet (5 mg total) by mouth daily. 12/12/18  Yes Rutherford Guys, MD  SYMBICORT 80-4.5 MCG/ACT inhaler INHALE 2 PUFFS BY MOUTH INTO THE LUNGS DAILY 06/04/19  Yes Rutherford Guys, MD    Past Medical History:  Diagnosis Date  .  Hypertension     Past Surgical History:  Procedure Laterality Date  . IR RADIOLOGIST EVAL & MGMT  04/09/2017  . MYOMECTOMY    . NOVASURE ABLATION      Social History   Tobacco Use  . Smoking status: Never Smoker  . Smokeless tobacco: Never Used  Substance Use Topics  . Alcohol use: Yes    Comment: occassional     Family History  Problem Relation Age of Onset  . Hypertension Mother   . Hypertension Father   . Heart disease Father   . Hypertension Maternal Grandmother   . Breast cancer Paternal Aunt   . Breast cancer Maternal Aunt   . Colon cancer Neg Hx     Review of Systems  Constitutional: Negative for chills and fever.  Respiratory: Negative for cough and shortness of breath.   Cardiovascular: Negative for chest pain, palpitations and leg swelling.  Gastrointestinal: Negative for abdominal pain, nausea and vomiting.  All other systems reviewed and are negative.    OBJECTIVE:  Today's Vitals   11/13/19 0754 11/13/19 0801 11/13/19 0840  BP: (!) 171/111 (!) 162/102 (!) 160/98  Pulse: (!) 102    Temp: 98.7 F (37.1 C)    TempSrc: Temporal    SpO2: 96%    Weight: 264 lb 9.6 oz (120 kg)    Height: 4\' 11"  (1.499 m)     Body mass index is 53.44 kg/m.   Physical Exam Vitals and nursing note reviewed.  Constitutional:      Appearance: She is well-developed.  HENT:     Head: Normocephalic and atraumatic.     Mouth/Throat:     Pharynx: No oropharyngeal exudate.  Eyes:     General: No scleral icterus.    Extraocular Movements: Extraocular movements intact.     Conjunctiva/sclera: Conjunctivae normal.     Pupils: Pupils are equal, round, and reactive to light.  Cardiovascular:     Rate and Rhythm: Normal rate and regular rhythm.     Heart sounds: Normal heart sounds. No murmur heard.  No friction rub. No gallop.   Pulmonary:     Effort: Pulmonary effort is normal.     Breath sounds: Normal breath sounds. No wheezing, rhonchi or rales.  Musculoskeletal:       Cervical back: Neck supple.     Right lower leg: No edema.     Left lower leg: No edema.  Skin:    General: Skin is warm and dry.  Neurological:     Mental Status: She is alert and oriented to person, place, and time.  No results found for this or any previous visit (from the past 24 hour(s)).  No results found.   ASSESSMENT and PLAN  1. Essential hypertension Not at goal, increase losartan 100mg , cont amlodipine 10mg  and hctz 12.5mg . cont LFM. RTC precautions given - Comprehensive metabolic panel - amLODipine (NORVASC) 10 MG tablet; Take 1 tablet (10 mg total) by mouth daily.  2. Hyperlipidemia, unspecified hyperlipidemia type Cont LFM, labs pending - Lipid panel  3. Prediabetes Labs pending. Cont LFM - Hemoglobin A1c  4. Moderate asthma without complication, unspecified whether persistent Stable. Cont current regime - budesonide-formoterol (SYMBICORT) 80-4.5 MCG/ACT inhaler; INHALE 2 PUFFS BY MOUTH INTO THE LUNGS DAILY  5. DUB (dysfunctional uterine bleeding) Trial off norethindrone as she is mostly likely postmenopausal. She is s/p ablation  6. Grief Declines counseling at this time  Other orders - albuterol (VENTOLIN HFA) 108 (90 Base) MCG/ACT inhaler; Inhale 2 puffs into the lungs every 6 (six) hours as needed for wheezing or shortness of breath. - potassium chloride (KLOR-CON M10) 10 MEQ tablet; TAKE 1 TABLET (10 MEQ TOTAL) BY MOUTH 2 (TWO) TIMES DAILY. - losartan-hydrochlorothiazide (HYZAAR) 100-12.5 MG tablet; Take 1 tablet by mouth daily.  Return in about 3 months (around 02/13/2020) for BP and DUB.    Rutherford Guys, MD Primary Care at [facility] [place], [place] [address] Ph.  [phone] Fax [phone]

## 2019-11-14 LAB — COMPREHENSIVE METABOLIC PANEL
ALT: 30 IU/L (ref 0–32)
AST: 27 IU/L (ref 0–40)
Albumin/Globulin Ratio: 1.8 (ref 1.2–2.2)
Albumin: 4.9 g/dL (ref 3.8–4.9)
Alkaline Phosphatase: 25 IU/L — ABNORMAL LOW (ref 48–121)
BUN/Creatinine Ratio: 12 (ref 9–23)
BUN: 10 mg/dL (ref 6–24)
Bilirubin Total: 0.8 mg/dL (ref 0.0–1.2)
CO2: 22 mmol/L (ref 20–29)
Calcium: 10.1 mg/dL (ref 8.7–10.2)
Chloride: 100 mmol/L (ref 96–106)
Creatinine, Ser: 0.83 mg/dL (ref 0.57–1.00)
GFR calc Af Amer: 93 mL/min/{1.73_m2} (ref >59)
GFR calc non Af Amer: 81 mL/min/{1.73_m2} (ref >59)
Globulin, Total: 2.7 g/dL (ref 1.5–4.5)
Glucose: 105 mg/dL — ABNORMAL HIGH (ref 65–99)
Potassium: 4 mmol/L (ref 3.5–5.2)
Sodium: 137 mmol/L (ref 134–144)
Total Protein: 7.6 g/dL (ref 6.0–8.5)

## 2019-11-14 LAB — LIPID PANEL
Chol/HDL Ratio: 5.4 ratio — ABNORMAL HIGH (ref 0.0–4.4)
Cholesterol, Total: 156 mg/dL (ref 100–199)
HDL: 29 mg/dL — ABNORMAL LOW (ref >39)
LDL Chol Calc (NIH): 114 mg/dL — ABNORMAL HIGH (ref 0–99)
Triglycerides: 66 mg/dL (ref 0–149)
VLDL Cholesterol Cal: 13 mg/dL (ref 5–40)

## 2019-11-14 LAB — HEMOGLOBIN A1C
Est. average glucose Bld gHb Est-mCnc: 143 mg/dL
Hgb A1c MFr Bld: 6.6 % — ABNORMAL HIGH (ref 4.8–5.6)

## 2019-11-17 ENCOUNTER — Encounter: Payer: Self-pay | Admitting: Family Medicine

## 2019-11-17 ENCOUNTER — Telehealth: Payer: Self-pay | Admitting: Family Medicine

## 2019-11-17 LAB — HM MAMMOGRAPHY

## 2019-11-17 NOTE — Telephone Encounter (Signed)
Pt has sent my chart message to dr Pamella Pert. Pt would like a personal leave of absent work note from 11-23-2019 until 01-01-2020. Pt will return to work on 2020-01-26 due to death in family. Pt saw dr Pamella Pert on 11/13/2019. Please put letter in her mychart

## 2019-11-18 ENCOUNTER — Telehealth: Payer: Self-pay | Admitting: Family Medicine

## 2019-11-18 ENCOUNTER — Other Ambulatory Visit: Payer: BC Managed Care – PPO

## 2019-11-18 NOTE — Telephone Encounter (Signed)
The patient's FMLA ppw ([place]) she brought in today is now in Dr Ardyth Gal box in [employer]. thanks

## 2019-11-18 NOTE — Telephone Encounter (Signed)
fmla paper work [facility] from  [employer] paper work received [employer] / placed paperwork to be completed by provider in Dr.Santiago/[facility] mail box [facility] nurses station

## 2019-11-20 NOTE — Telephone Encounter (Signed)
Called pt to talk about fmla and letter requested.

## 2019-12-03 ENCOUNTER — Other Ambulatory Visit: Payer: Self-pay | Admitting: Family Medicine

## 2019-12-03 MED ORDER — METFORMIN HCL 500 MG PO TABS
500.0000 mg | ORAL_TABLET | Freq: Every day | ORAL | 1 refills | Status: DC
Start: 2019-12-03 — End: 2020-05-16

## 2019-12-06 ENCOUNTER — Other Ambulatory Visit: Payer: Self-pay | Admitting: Family Medicine

## 2019-12-06 NOTE — Telephone Encounter (Signed)
Requested Prescriptions  Pending Prescriptions Disp Refills  . norethindrone (AYGESTIN) 5 MG tablet [Pharmacy Med Name: NORETHINDRONE 5 MG TABLET] 90 tablet 3    Sig: TAKE 1 TABLET BY MOUTH EVERY DAY     OB/GYN: Contraceptives - Progestins Passed - 12/06/2019  9:28 AM      Passed - Valid encounter within last 12 months    Recent Outpatient Visits          3 weeks ago Essential hypertension   [facility], Lilia Argue, MD   8 months ago Chronic bilateral low back pain without sciatica   [facility], Lilia Argue, MD   9 months ago Need for prophylactic vaccination against single diseases   [facility], Lilia Argue, MD   11 months ago Encounter for Papanicolaou smear for cervical cancer screening   [facility], Lilia Argue, MD   1 year ago Need for influenza vaccination   [facility], Lilia Argue, MD

## 2019-12-23 ENCOUNTER — Encounter: Payer: Self-pay | Admitting: Family Medicine

## 2019-12-25 ENCOUNTER — Other Ambulatory Visit: Payer: Self-pay

## 2019-12-25 ENCOUNTER — Encounter: Payer: Self-pay | Admitting: Family Medicine

## 2019-12-25 ENCOUNTER — Ambulatory Visit: Payer: BC Managed Care – PPO | Admitting: Family Medicine

## 2019-12-25 VITALS — BP 144/92 | HR 89 | Temp 98.1°F | Ht 59.0 in | Wt 263.0 lb

## 2019-12-25 DIAGNOSIS — N938 Other specified abnormal uterine and vaginal bleeding: Secondary | ICD-10-CM

## 2019-12-25 DIAGNOSIS — Z23 Encounter for immunization: Secondary | ICD-10-CM | POA: Diagnosis not present

## 2019-12-25 DIAGNOSIS — E119 Type 2 diabetes mellitus without complications: Secondary | ICD-10-CM | POA: Diagnosis not present

## 2019-12-25 DIAGNOSIS — I10 Essential (primary) hypertension: Secondary | ICD-10-CM

## 2019-12-25 MED ORDER — HYDROCHLOROTHIAZIDE 12.5 MG PO TABS: 12.5000 mg | ORAL_TABLET | Freq: Every day | ORAL | 0 refills | Status: DC

## 2019-12-25 NOTE — Patient Instructions (Signed)
° ° ° °  If you have lab work done today you will be contacted with your lab results within the next 2 weeks.  If you have not heard from us then please contact us. The fastest way to get your results is to register for My Chart. ° ° °IF you received an x-ray today, you will receive an invoice from [facility]. Please contact [facility] at [phone] with questions or concerns regarding your invoice.  ° °IF you received labwork today, you will receive an invoice from [facility]. Please contact [facility] at [phone] with questions or concerns regarding your invoice.  ° °Our billing staff will not be able to assist you with questions regarding bills from these companies. ° °You will be contacted with the lab results as soon as they are available. The fastest way to get your results is to activate your My Chart account. Instructions are located on the last page of this paperwork. If you have not heard from us regarding the results in 2 weeks, please contact this office. °  ° ° ° °

## 2019-12-25 NOTE — Progress Notes (Signed)
9/24/20218:45 AM  Debra Mendoza [birth year], 54 y.o., female [id number]  Chief Complaint  Patient presents with  . Hypertension    brought BP cuff to compare readings - 154/94      HPI:   Patient is a 54 y.o. female with past medical history significant for HTN, HLP, prediabetes, asthma who presents today for routine followup  Last OV aug 2021 - increased losartan to 100mg  daily, trial of BC, started metformin  She is overall doing ok Her home BP cuff has not been reading accurately, reading very high She has started having intermittent return of menstrual bleeding since stopping norethindrone 5mg  daily She is tolerating metformin well She has not had headaches for past 2 days She has cut back on pasta, adding whole wheat, eating more veggies, working on cutting back on fried food, stopped gatorade, drinking more water  Lab Results  Component Value Date   HGBA1C 6.6 (H) 11/13/2019   HGBA1C 6.4 (H) 11/24/2018   HGBA1C 6.4 (H) 06/20/2017   Lab Results  Component Value Date   MICROALBUR 0.50 12/31/2011   LDLCALC 114 (H) 11/13/2019   CREATININE 0.83 11/13/2019   Lab Results  Component Value Date   CREATININE 0.83 11/13/2019   BUN 10 11/13/2019   NA 137 11/13/2019   K 4.0 11/13/2019   CL 100 11/13/2019   CO2 22 11/13/2019    Depression screen PHQ 2/9 12/25/2019 11/13/2019 03/26/2019  Decreased Interest 0 2 0  Down, Depressed, Hopeless - 2 0  PHQ - 2 Score 0 4 0  Altered sleeping 0 3 -  Tired, decreased energy 0 2 -  Change in appetite 0 2 -  Feeling bad or failure about yourself  0 1 -  Trouble concentrating 0 2 -  Moving slowly or fidgety/restless 0 0 -  Suicidal thoughts 0 0 -  PHQ-9 Score 0 14 -  Difficult doing work/chores Not difficult at all Somewhat difficult -    Fall Risk  12/25/2019 11/13/2019 03/26/2019 12/12/2018 11/11/2018  Falls in the past year? 0 0 0 0 0  Number falls in past yr: 0 0 0 0 0  Injury with Fall? 0 0 0 0 0  Follow up Falls  evaluation completed Falls evaluation completed Falls evaluation completed - -     No Known Allergies  Prior to Admission medications   Medication Sig Start Date End Date Taking? Authorizing Provider  albuterol (VENTOLIN HFA) 108 (90 Base) MCG/ACT inhaler Inhale 2 puffs into the lungs every 6 (six) hours as needed for wheezing or shortness of breath. 11/13/19  Yes Rutherford Guys, MD  amLODipine (NORVASC) 10 MG tablet Take 1 tablet (10 mg total) by mouth daily. 11/13/19  Yes Rutherford Guys, MD  Ascorbic Acid (VITAMIN C) 100 MG tablet Take 1,000 mg by mouth daily.    Yes [provider]  Biotin 5000 MCG CAPS Take by mouth.   Yes [provider]  Black Cohosh 40 MG CAPS Take 40 mg by mouth.   Yes [provider]  budesonide-formoterol (SYMBICORT) 80-4.5 MCG/ACT inhaler INHALE 2 PUFFS BY MOUTH INTO THE LUNGS DAILY 11/13/19  Yes Rutherford Guys, MD  Calcium Carbonate-Vitamin D (CALCIUM 600+D PO) Take 1 tablet by mouth daily.   Yes [provider]  losartan-hydrochlorothiazide (HYZAAR) 100-12.5 MG tablet Take 1 tablet by mouth daily. 11/13/19  Yes Rutherford Guys, MD  metFORMIN (GLUCOPHAGE) 500 MG tablet Take 1 tablet (500 mg total) by mouth daily with breakfast.  12/03/19  Yes Rutherford Guys, MD  potassium chloride (KLOR-CON M10) 10 MEQ tablet TAKE 1 TABLET (10 MEQ TOTAL) BY MOUTH 2 (TWO) TIMES DAILY. 11/13/19  Yes Rutherford Guys, MD  norethindrone (AYGESTIN) 5 MG tablet TAKE 1 TABLET BY MOUTH EVERY DAY Patient not taking: Reported on 12/25/2019 12/06/19   Rutherford Guys, MD    Past Medical History:  Diagnosis Date  . Hypertension     Past Surgical History:  Procedure Laterality Date  . IR RADIOLOGIST EVAL & MGMT  04/09/2017  . MYOMECTOMY    . NOVASURE ABLATION      Social History   Tobacco Use  . Smoking status: Never Smoker  . Smokeless tobacco: Never Used  Substance Use Topics  . Alcohol use: Yes    Comment: occassional     Family History    Problem Relation Age of Onset  . Hypertension Mother   . Hypertension Father   . Heart disease Father   . Hypertension Maternal Grandmother   . Breast cancer Paternal Aunt   . Breast cancer Maternal Aunt   . Colon cancer Neg Hx     Review of Systems  Constitutional: Negative for chills and fever.  Respiratory: Negative for cough and shortness of breath.   Cardiovascular: Negative for chest pain, palpitations and leg swelling.  Gastrointestinal: Negative for abdominal pain, nausea and vomiting.  per hpi  OBJECTIVE:  Today's Vitals   12/25/19 0820 12/25/19 0828  BP: (!) 154/99 (!) 144/92  Pulse: 89   Temp: 98.1 F (36.7 C)   SpO2: 97%   Weight: 263 lb (119.3 kg)   Height: 4\' 11"  (1.499 m)    Body mass index is 53.12 kg/m.  Wt Readings from Last 3 Encounters:  12/25/19 263 lb (119.3 kg)  11/13/19 264 lb 9.6 oz (120 kg)  03/26/19 267 lb 12.8 oz (121.5 kg)    Physical Exam Vitals and nursing note reviewed.  Constitutional:      Appearance: She is well-developed.  HENT:     Head: Normocephalic and atraumatic.     Mouth/Throat:     Pharynx: No oropharyngeal exudate.  Eyes:     General: No scleral icterus.    Extraocular Movements: Extraocular movements intact.     Conjunctiva/sclera: Conjunctivae normal.     Pupils: Pupils are equal, round, and reactive to light.  Cardiovascular:     Rate and Rhythm: Normal rate and regular rhythm.     Heart sounds: Normal heart sounds. No murmur heard.  No friction rub. No gallop.   Pulmonary:     Effort: Pulmonary effort is normal.     Breath sounds: Normal breath sounds. No wheezing, rhonchi or rales.  Musculoskeletal:     Cervical back: Neck supple.  Skin:    General: Skin is warm and dry.  Neurological:     Mental Status: She is alert and oriented to person, place, and time.     No results found for this or any previous visit (from the past 24 hour(s)).  No results found.   ASSESSMENT and PLAN  1. Essential  hypertension Above goal. Increase HCTZ to 25mg  TDD. Continue working on LFM  2. Type 2 diabetes mellitus without complication, without long-term current use of insulin (HCC) At goal. Cont working on LFM - Microalbumin / creatinine urine ratio  3. DUB (dysfunctional uterine bleeding) Resumed after d/c norethindrone. Patient would like to monitor for another month or so and see if bleeding stops, if not then  she would like to restart medication.  4. Encounter for immunization - Flu Vaccine QUAD 36+ mos IM  Other orders - hydrochlorothiazide (HYDRODIURIL) 12.5 MG tablet; Take 1 tablet (12.5 mg total) by mouth daily.  Return in about 3 months (around 03/25/2020).    Rutherford Guys, MD [facility] [place], [place] [address] Ph.  [phone] Fax [phone]

## 2019-12-26 LAB — MICROALBUMIN / CREATININE URINE RATIO
Creatinine, Urine: 113 mg/dL
Microalb/Creat Ratio: 12 mg/g creat (ref 0–29)
Microalbumin, Urine: 13.1 ug/mL

## 2020-05-16 ENCOUNTER — Other Ambulatory Visit: Payer: Self-pay | Admitting: Family Medicine

## 2020-05-16 NOTE — Telephone Encounter (Signed)
Pt needs appt for transfer of care. Last seen by Dr. Pamella Pert 12/25/19

## 2020-05-16 NOTE — Telephone Encounter (Signed)
Medication: hydrochlorothiazide (HYDRODIURIL) 12.5 MG tablet [[id number]] , losartan-hydrochlorothiazide (HYZAAR) 100-12.5 MG tablet [[id number]] , metFORMIN (GLUCOPHAGE) 500 MG tablet [[id number]]   Has the patient contacted their pharmacy? YES  (Agent: If no, request that the patient contact the pharmacy for the refill.) (Agent: If yes, when and what did the pharmacy advise?)  Preferred Pharmacy (with phone number or street name): [id number] - [place], [place]  [address] [place], [place] [address]  Phone:  [phone] Fax:  [phone]   Agent: Please be advised that RX refills may take up to 3 business days. We ask that you follow-up with your pharmacy.

## 2020-05-17 MED ORDER — METFORMIN HCL 500 MG PO TABS
500.0000 mg | ORAL_TABLET | Freq: Every day | ORAL | 1 refills | Status: DC
Start: 2020-05-17 — End: 2022-08-25

## 2020-05-17 MED ORDER — HYDROCHLOROTHIAZIDE 12.5 MG PO TABS
12.5000 mg | ORAL_TABLET | Freq: Every day | ORAL | 0 refills | Status: DC
Start: 2020-05-17 — End: 2022-08-25

## 2020-05-17 MED ORDER — LOSARTAN POTASSIUM-HCTZ 100-12.5 MG PO TABS: 1.0000 | ORAL_TABLET | Freq: Every day | ORAL | 1 refills | Status: DC

## 2022-01-13 LAB — COLOGUARD: COLOGUARD: NEGATIVE

## 2022-08-24 ENCOUNTER — Ambulatory Visit: Payer: BC Managed Care – PPO | Admitting: Internal Medicine

## 2022-08-24 ENCOUNTER — Encounter: Payer: Self-pay | Admitting: Internal Medicine

## 2022-08-24 ENCOUNTER — Ambulatory Visit (HOSPITAL_COMMUNITY)
Admission: RE | Admit: 2022-08-24 | Discharge: 2022-08-24 | Disposition: A | Discharge status: Home / Self Care | Payer: BC Managed Care – PPO | Source: Ambulatory Visit | Attending: Internal Medicine | Admitting: Internal Medicine

## 2022-08-24 ENCOUNTER — Other Ambulatory Visit (HOSPITAL_BASED_OUTPATIENT_CLINIC_OR_DEPARTMENT_OTHER): Payer: Self-pay | Admitting: Internal Medicine

## 2022-08-24 VITALS — BP 163/100 | HR 79 | Ht 60.0 in | Wt 259.4 lb

## 2022-08-24 DIAGNOSIS — R0609 Other forms of dyspnea: Secondary | ICD-10-CM | POA: Insufficient documentation

## 2022-08-24 LAB — POCT EXHALED NITRIC OXIDE: FeNO level (ppb): 40

## 2022-08-24 NOTE — Progress Notes (Unsigned)
   Debra Mendoza, female    DOB: 1966/01/15    MRN: [id number]   Brief patient profile:  20   yobf  never smoker prev eval by MR in 2018 p pna self referred to pulmonary clinic in [place]  08/24/2022 by doe and voice loss     History of Present Illness  08/24/2022  Pulmonary/ 1st office eval/ Sherene Sires / [place] [facility]  Chief Complaint  Patient presents with   Consult    Pt consult/self referral former pt of MR LOV 03/2016 - tx for asthma  Dyspnea:  [employer] slow pace or has to stop  = .MMRC2 = can't walk a nl pace on a flat grade s sob but does fine slow and flat eg  Cough: none  Sleep: about 45 degrees electric bed x sev years  SABA use: not needed  02: none     Past Medical History:  Diagnosis Date   Hypertension     Outpatient Medications Prior to Visit  Medication Sig Dispense Refill   albuterol (VENTOLIN HFA) 108 (90 Base) MCG/ACT inhaler Inhale 2 puffs into the lungs every 6 (six) hours as needed for wheezing or shortness of breath. 8 g 3   amLODipine (NORVASC) 10 MG tablet Take 1 tablet (10 mg total) by mouth daily. 90 tablet 1   budesonide-formoterol (SYMBICORT) 80-4.5 MCG/ACT inhaler INHALE 2 PUFFS BY MOUTH INTO THE LUNGS DAILY 10.2 g 5   losartan-hydrochlorothiazide (HYZAAR) 100-12.5 MG tablet Take 1 tablet by mouth daily. 90 tablet 1   Spacer/Aero-Holding Chambers (EQ SPACE CHAMBER ANTI-STATIC) DEVI See admin instructions. use with inhaler     Ascorbic Acid (VITAMIN C) 100 MG tablet Take 1,000 mg by mouth daily.      Biotin 5000 MCG CAPS Take by mouth.     Black Cohosh 40 MG CAPS Take 40 mg by mouth.     Calcium Carbonate-Vitamin D (CALCIUM 600+D PO) Take 1 tablet by mouth daily.     hydrochlorothiazide (HYDRODIURIL) 12.5 MG tablet Take 1 tablet (12.5 mg total) by mouth daily. 60 tablet 0   metFORMIN (GLUCOPHAGE) 500 MG tablet Take 1 tablet (500 mg total) by mouth daily with breakfast. 90 tablet 1   potassium chloride (KLOR-CON M10) 10 MEQ tablet TAKE 1 TABLET  (10 MEQ TOTAL) BY MOUTH 2 (TWO) TIMES DAILY. 180 tablet 1   No facility-administered medications prior to visit.     Objective:     BP (!) 163/100   Pulse 79   Ht 5' (1.524 m)   Wt 259 lb 6.4 oz (117.7 kg)   SpO2 95% Comment: RA  BMI 50.66 kg/m   SpO2: 95 % (RA)   Wt Readings from Last 3 Encounters:  08/24/22 259 lb 6.4 oz (117.7 kg)  12/25/19 263 lb (119.3 kg)  11/13/19 264 lb 9.6 oz (120 kg)      Vital signs reviewed  08/24/2022  - Note at rest 02 sats  ***% on ***   General appearance:    ***        Assessment   No problem-specific Assessment & Plan notes found for this encounter.     Sandrea Hughs, MD 08/24/2022

## 2022-08-24 NOTE — Patient Instructions (Addendum)
Reduce your symbicort 80 to one twice daily - increase to  2 every 12 hours if needed   Work on inhaler technique:  relax and gently blow all the way out then take a nice smooth full deep breath back in, triggering the inhaler at same time you start breathing in.  Hold breath in for at least  5 seconds if you can. Blow out symbicort  thru nose. Rinse and gargle with water when done.  If mouth or throat bother you at all,  try brushing teeth/gums/tongue with arm and hammer toothpaste/ make a slurry and gargle and spit out.   Only use your albuterol as a rescue medication to be used if you can't catch your breath by resting or doing a relaxed purse lip breathing pattern.  - The less you use it, the better it will work when you need it. - Ok to use up to 2 puffs  every 4 hours if you must but call for immediate appointment if use goes up over your usual need - Don't leave home without it !!  (think of it like the spare tire for your car)   Also  Ok to try albuterol 15 min before an activity (on alternating days)  that you know would usually make you short of breath and see if it makes any difference and if makes none then don't take albuterol after activity unless you can't catch your breath as this means it's the resting that helps, not the albuterol.  GERD (REFLUX)  is an extremely common cause of respiratory symptoms just like yours , many times with no obvious heartburn at all.    It can be treated with medication, but also with lifestyle changes including elevation of the head of your bed (ideally with 6 -8inch blocks under the headboard of your bed),  Smoking cessation, avoidance of late meals, excessive alcohol, and avoid fatty foods, chocolate, peppermint, colas, red wine, and acidic juices such as orange juice.  NO MINT OR MENTHOL PRODUCTS SO NO COUGH DROPS  USE SUGARLESS CANDY INSTEAD (Jolley ranchers or Stover's or Life Savers) or even ice chips will also do - the key is to swallow to prevent  all throat clearing. NO OIL BASED VITAMINS - use powdered substitutes.  Avoid fish oil when coughing.   If still hoarse or coughing try Try prilosec otc 20mg   Take 30-60 min before first meal of the day and Pepcid ac (famotidine) 20 mg one after supper until cough is completely gone for at least a week without the need for cough suppression      Please remember to go to the lab department   for your tests - we will call you with the results when they are available.     Please remember to go to the  x-ray department  @  [facility] for your tests - we will call you with the results when they are available     Please schedule a follow up visit in 3 months but call sooner if needed with PFTs on return office visit

## 2022-08-25 NOTE — Assessment & Plan Note (Addendum)
Onset p pna 2015 dx as asthma - FENO  5/24//24 40 on symbicor 80 with poor technique - 08/24/2022  After extensive coaching inhaler device,  effectiveness =   75% from baseline < 30% (very short Ti)  >> rec try symbicort 80 one bid since main symptoms is hoarseness and may have component of pseudowheeze   Symptoms are  disproportionate to objective findings and not clear to what extent this is actually a pulmonary  problem but pt does appear to have difficult to sort out respiratory symptoms of unknown origin for which  DDX  = almost all start with A and  include Adherence, Ace Inhibitors, Acid Reflux, Active Sinus Disease, Alpha 1 Antitripsin deficiency, Anxiety masquerading as Airways dz,  ABPA,  Allergy(esp in young), Aspiration (esp in elderly), Adverse effects of meds,  Active smoking or Vaping, A bunch of PE's/clot burden (a few small clots can't cause this syndrome unless there is already severe underlying pulm or vascular dz with poor reserve),  Anemia or thyroid disorder, plus two Bs  = Bronchiectasis and Beta blocker use..and one C= CHF    Bolded dx's most likely   Adherence is always the initial "prime suspect" and is a multilayered concern that requires a "trust but verify" approach in every patient - starting with knowing how to use medications, especially inhalers, correctly, keeping up with refills and understanding the fundamental difference between maintenance and prns vs those medications only taken for a very short course and then stopped and not refilled.  - see hfa teaching - return with all meds in hand using a trust but verify approach to confirm accurate Medication  Reconciliation The principal here is that until we are certain that the  patients are doing what we've asked, it makes no sense to ask them to do more.   ?allergy/asthma > FENO is sltly elevated but hfa suboptimal and may end up needing less ICS if can use it more effectively/ consisently > f/u with pfts on return /  IgE/ Eos sent  ? Acid reflux > try diet first and if not better Try prilosec otc 20mg   Take 30-60 min before first meal of the day and Pepcid ac (famotidine) 20 mg one @  bedtime until cough is completely gone for at least a week without the need for cough suppression     ? Anxiety/depression/ deconditioning assoc with wt gain  > usually at the bottom of this list of usual suspects but  may interfere with adherence and also interpretation of response or lack thereof to symptom management which can be quite subjective.   A bunch of PEs > check d dimer  ? Bronchiectasis > suggested by hx of "not right since pna in 2015"  ? CHF/ note hbp/cm so may at least have diastolic dysfunction > check BNP  F/u in 3 m with pfts and work on conditioning in meantime

## 2022-08-25 NOTE — Assessment & Plan Note (Addendum)
Body mass index is 50.66 kg/m.  -    Lab Results  Component Value Date   TSH 2.970 11/24/2018  Repeat TSH pending    Contributing to doe and risk of GERD >>>   reviewed the need and the process to achieve and maintain neg calorie balance > defer f/u primary care including intermittently monitoring thyroid status     Each maintenance medication was reviewed in detail including emphasizing most importantly the difference between maintenance and prns and under what circumstances the prns are to be triggered using an action plan format where appropriate.  Total time for H and P, chart review, counseling, reviewing hfa device(s) and generating customized AVS unique to this office visit / same day charting  > 60  min with new pt eval

## 2022-08-28 LAB — BASIC METABOLIC PANEL
BUN/Creatinine Ratio: 19 (ref 9–23)
BUN: 16 mg/dL (ref 6–24)
CO2: 24 mmol/L (ref 20–29)
Calcium: 9.7 mg/dL (ref 8.7–10.2)
Chloride: 102 mmol/L (ref 96–106)
Creatinine, Ser: 0.83 mg/dL (ref 0.57–1.00)
Glucose: 77 mg/dL (ref 70–99)
Potassium: 4.3 mmol/L (ref 3.5–5.2)
Sodium: 141 mmol/L (ref 134–144)
eGFR: 83 mL/min/{1.73_m2} (ref >59)

## 2022-08-28 LAB — TSH: TSH: 0.817 u[IU]/mL (ref 0.450–4.500)

## 2022-08-28 LAB — D-DIMER, QUANTITATIVE

## 2022-08-28 LAB — IGE: IgE (Immunoglobulin E), Serum: 115 IU/mL (ref 6–495)

## 2022-09-04 ENCOUNTER — Institutional Professional Consult (permissible substitution): Payer: BC Managed Care – PPO | Admitting: Internal Medicine

## 2022-09-21 ENCOUNTER — Encounter (INDEPENDENT_AMBULATORY_CARE_PROVIDER_SITE_OTHER): Payer: Self-pay

## 2022-11-09 ENCOUNTER — Other Ambulatory Visit: Payer: Self-pay

## 2022-11-09 DIAGNOSIS — R0609 Other forms of dyspnea: Secondary | ICD-10-CM

## 2022-11-11 NOTE — Progress Notes (Unsigned)
Debra Mendoza, female    DOB: 01/03/1966    MRN: [id number]   Brief patient profile:  65   yobf  never smoker prev eval by MR in 2018 for cough on ACEi at wt 247 self referred to pulmonary clinic in [place]  08/24/2022 for  doe "wheezing" and voice loss   Onset of symptoms 2015 p admit with pna and rx for asthma ever since with persistent doe but no pfts to date (ordered/ not done)      History of Present Illness  08/24/2022  Pulmonary/ 1st office eval/ Shah Insley / [place] [facility] maint on symb 80  Chief Complaint  Patient presents with   Consult    Pt consult/self referral former pt of MR LOV 03/2016 - tx for asthma  Dyspnea:  [employer] slow pace or has to stop  = .MMRC2 = can't walk a nl pace on a flat grade s sob but does fine slow and flat   Cough: none  Sleep: about 45 degrees electric bed x sev years  SABA use: not using   02: none  Rec Reduce your symbicort 80 to one twice daily - increase to  2 every 12 hours if needed  Work on inhaler technique:  Only use your albuterol as a rescue medication  Also Ok to try albuterol 15 min before an activity (on alternating days)  that you know would usually make you short of breath  GERD diet reviewed, bed blocks rec  If still hoarse or coughing try Try prilosec otc 20mg   Take 30-60 min before first meal of the day and Pepcid ac (famotidine) 20 mg one after supper until cough is completely gone for at least a week without the need for cough suppression Please remember to go to the lab department   : FENO 40    11/12/2022  f/u ov/Tonie Vizcarrondo re: cough maint on symbicort 80 2puffs  each am   with restrictive changes on pfts and ERV 1% at wt 265  Chief Complaint  Patient presents with   Follow-up    PFT done today. She states her breathing is about the same. She c/o wheezing. She rarely uses her albuterol.   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     Also denies any obvious fluctuation of symptoms with weather or environmental changes or other aggravating or alleviating factors except as outlined above   No unusual exposure hx or h/o childhood pna/ asthma or knowledge of premature birth.  Current Allergies, Complete Past Medical History, Past Surgical History, Family History, and Social History were reviewed in [employer] record.  ROS  The following are not active complaints unless bolded Hoarseness, sore throat, dysphagia, dental problems, itching, sneezing,  nasal congestion or discharge of excess mucus or purulent secretions, ear ache,   fever, chills, sweats, unintended wt loss or wt gain, classically pleuritic or exertional cp,  orthopnea pnd or arm/hand swelling  or leg swelling, presyncope, palpitations, abdominal pain, anorexia, nausea, vomiting, diarrhea  or change in bowel habits or change in bladder habits, change in stools or change  in urine, dysuria, hematuria,  rash, arthralgias, visual complaints, headache, numbness, weakness or ataxia or problems with walking or coordination,  change in mood or  memory.        Current Meds  Medication Sig   albuterol (VENTOLIN HFA) 108 (90 Base) MCG/ACT inhaler Inhale 2 puffs into the lungs every 6 (six) hours as needed for wheezing or shortness of breath.   amLODipine (NORVASC) 10 MG tablet Take 1 tablet (10 mg total) by mouth daily.   budesonide-formoterol (SYMBICORT) 80-4.5 MCG/ACT inhaler INHALE 2 PUFFS BY MOUTH INTO THE LUNGS DAILY   losartan-hydrochlorothiazide (HYZAAR) 100-12.5 MG tablet Take 1 tablet by mouth daily.   Spacer/Aero-Holding Chambers (EQ SPACE CHAMBER ANTI-STATIC) DEVI See admin instructions. use with inhaler                     Past Medical History:  Diagnosis Date   Hypertension        Objective:     wts  11/12/2022       264    08/24/22 259 lb 6.4 oz (117.7 kg)  12/25/19 263 lb (119.3 kg)  11/13/19 264 lb 9.6 oz (120 kg)   Vital signs reviewed  11/12/2022  - Note at rest 02 sats  97% on RA    General appearance:    amb MO  (by BMI)    HEENT : Oropharynx  clear/ M1 airway      Nasal turbinates nl    NECK :  without  apparent JVD/ palpable Nodes/TM    LUNGS: no acc muscle use,  Nl contour chest which is clear to A and P bilaterally without cough on insp or exp maneuvers   CV:  RRR  no s3 or murmur or increase in P2, and no edema   ABD:  tensely obese  nontender with limited inspiratory excursion   MS:  Nl gait/ ext warm without deformities Or obvious joint restrictions  calf tenderness, cyanosis or clubbing    SKIN: warm and dry without lesions    NEURO:  alert, approp, nl sensorium with  no motor or cerebellar deficits apparent.        CXR PA and Lateral:   08/24/2022 :    I personally reviewed images and impression is as follows:     Small lung vol/ prominent R PA/ CM     Assessment

## 2022-11-12 ENCOUNTER — Ambulatory Visit (INDEPENDENT_AMBULATORY_CARE_PROVIDER_SITE_OTHER): Payer: BC Managed Care – PPO | Admitting: Internal Medicine

## 2022-11-12 ENCOUNTER — Ambulatory Visit: Payer: BC Managed Care – PPO | Admitting: Internal Medicine

## 2022-11-12 ENCOUNTER — Encounter: Payer: Self-pay | Admitting: Internal Medicine

## 2022-11-12 VITALS — BP 128/68 | HR 94 | Ht 60.0 in | Wt 264.8 lb

## 2022-11-12 DIAGNOSIS — R0609 Other forms of dyspnea: Secondary | ICD-10-CM | POA: Diagnosis not present

## 2022-11-12 NOTE — Assessment & Plan Note (Signed)
PFTs with ERV1%  11/12/2022   Body mass index is 51.72 kg/m.  -  trending up  Lab Results  Component Value Date   TSH 0.817 08/24/2022      Contributing to doe and risk of GERD/dvt/ PE >>>   reviewed the need and the process to achieve and maintain neg calorie balance > defer f/u primary care including intermittently monitoring thyroid status     F/u q 6 m in [facility] office, sooner if needed

## 2022-11-12 NOTE — Assessment & Plan Note (Addendum)
Onset p pna 2015 dx as asthma - FENO  5/24//24 40 on symbicor 80 with poor technique - 08/24/2022  After extensive coaching inhaler device,  effectiveness =   75% from baseline < 30% (very short Ti)  >> rec try symbicort 80 one bid since main symptoms is hoarseness and may have component of pseudowheeze  - Allergy screen  08/24/22 >   IgE  115   - PFT's  11/12/2022   restrictive changes  p symbicort 80 1 puff  prior to study  and FV curve nl  and ERV 1% at wt 265              Asthma is likely present but the predominant problem is MO effects on lung vol   Rec  Work on wt loss No change resp rx   Each maintenance medication was reviewed in detail including emphasizing most importantly the difference between maintenance and prns and under what circumstances the prns are to be triggered using an action plan format where appropriate.  Total time for H and P, chart review, counseling, reviewing hfa  device(s) and generating customized AVS unique to this office visit / same day charting > 30 min summary f/u ov

## 2022-11-12 NOTE — Patient Instructions (Addendum)
No change in your medications   Keep up the work on the weight  Please schedule a follow up visit in 6  months but call sooner if needed in [place] office.

## 2022-11-12 NOTE — Patient Instructions (Signed)
Full PFT performed today. °

## 2022-11-12 NOTE — Progress Notes (Signed)
Full PFT performed today. °

## 2022-11-26 ENCOUNTER — Encounter (INDEPENDENT_AMBULATORY_CARE_PROVIDER_SITE_OTHER): Payer: Self-pay

## 2023-01-22 ENCOUNTER — Ambulatory Visit: Payer: BC Managed Care – PPO | Admitting: Family Medicine

## 2023-07-29 ENCOUNTER — Ambulatory Visit: Payer: 59 | Admitting: Family Medicine

## 2023-07-29 ENCOUNTER — Encounter: Payer: Self-pay | Admitting: Family Medicine

## 2023-07-29 VITALS — BP 142/70 | HR 88 | Resp 16 | Ht 60.0 in | Wt 260.8 lb

## 2023-07-29 DIAGNOSIS — Z114 Encounter for screening for human immunodeficiency virus [HIV]: Secondary | ICD-10-CM

## 2023-07-29 DIAGNOSIS — I10 Essential (primary) hypertension: Secondary | ICD-10-CM | POA: Diagnosis not present

## 2023-07-29 DIAGNOSIS — E038 Other specified hypothyroidism: Secondary | ICD-10-CM

## 2023-07-29 DIAGNOSIS — E559 Vitamin D deficiency, unspecified: Secondary | ICD-10-CM

## 2023-07-29 DIAGNOSIS — E7849 Other hyperlipidemia: Secondary | ICD-10-CM | POA: Diagnosis not present

## 2023-07-29 DIAGNOSIS — R7301 Impaired fasting glucose: Secondary | ICD-10-CM | POA: Diagnosis not present

## 2023-07-29 DIAGNOSIS — Z1159 Encounter for screening for other viral diseases: Secondary | ICD-10-CM

## 2023-07-29 MED ORDER — OLMESARTAN-AMLODIPINE-HCTZ 40-10-12.5 MG PO TABS: 1.0000 | ORAL_TABLET | Freq: Every day | ORAL | 1 refills | Status: DC

## 2023-07-29 NOTE — Assessment & Plan Note (Deleted)
 The patient reports that she has been implement lifestyle modification with minimal changes in her weight I recommended that  she verify if her insurance covers [id number]. For optimal results with weight loss, I recommend: Decreasing portion sizes. Reducing sugar, sodium, and carbohydrate intake, and limiting saturated fats in your diet. Increasing fiber intake by incorporating more whole grains, fruits, and vegetables. Setting healthy goals and focusing on lowering carbohydrate, sugar, and fat intake. Increasing the variety of fruits and vegetables in your diet. Reducing soda consumption and limiting processed foods. Engaging in moderate-intensity physical activity for at least 150 minutes per week for the best results.

## 2023-07-29 NOTE — Progress Notes (Signed)
 New Patient Office Visit  Subjective:  Patient ID: Debra Mendoza, female    DOB: [birth year]  Age: 58 y.o. MRN: [id number]  CC:  Chief Complaint  Patient presents with   Establish Care    Wants to discuss weight loss meds and her blood pressure. Hasn't seen a Dr in [place]. Wants all labs checked as well     HPI Debra Mendoza is a 58 y.o. female with past medical history of hypertension, obesity and moderate asthma without complication presents for establishing care. For the details of today's visit, please refer to the assessment and plan.    Past Medical History:  Diagnosis Date   Asthma 2018   Cataract 2024   Clotting disorder (HCC) 2025   Fibroids   GERD (gastroesophageal reflux disease) 2024   Hypertension     Past Surgical History:  Procedure Laterality Date   IR RADIOLOGIST EVAL & MGMT  04/09/2017   MYOMECTOMY     NOVASURE ABLATION      Family History  Problem Relation Age of Onset   Hypertension Mother    Hypertension Father    Heart disease Father    Hypertension Maternal Grandmother    Breast cancer Paternal Aunt    Breast cancer Maternal Aunt    Colon cancer Neg Hx     Social History   Socioeconomic History   Marital status: Married    Spouse name: Not on file   Number of children: Not on file   Years of education: Not on file   Highest education level: 12th grade  Occupational History   Not on file  Tobacco Use   Smoking status: Never   Smokeless tobacco: Never  Substance and Sexual Activity   Alcohol use: Yes    Alcohol/week: 1.0 standard drink of alcohol    Types: 1 Glasses of wine per week    Comment: occassional    Drug use: No   Sexual activity: Yes    Birth control/protection: None  Other Topics Concern   Not on file  Social History Narrative   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    Social Drivers of Health   Financial Resource Strain: Low Risk  (07/25/2023)   Overall Financial Resource Strain (CARDIA)    Difficulty  of Paying Living Expenses: Not hard at all  Food Insecurity: No Food Insecurity (07/25/2023)   Hunger Vital Sign    Worried About Running Out of Food in the Last Year: Never true    Ran Out of Food in the Last Year: Never true  Transportation Needs: No Transportation Needs (07/25/2023)   PRAPARE - Administrator, Civil Service (Medical): No    Lack of Transportation (Non-Medical): No  Physical Activity: Unknown (07/25/2023)   Exercise Vital Sign    Days of Exercise per Week: 0 days    Minutes of Exercise per Session: Not on file  Stress: No Stress Concern Present (07/25/2023)   [employer] of Occupational Health - Occupational Stress Questionnaire    Feeling of Stress : Only a little  Social Connections: Socially Isolated (07/25/2023)   Social Connection and Isolation Panel [NHANES]    Frequency of Communication with Friends and Family: Once a week    Frequency of Social Gatherings with Friends and Family: Never    Attends Religious Services: Never    Database administrator or Organizations: No    Attends Engineer, structural: Not on file    Marital Status: Married  Intimate Partner Violence: Not At Risk (04/13/2022)   Received from [facility], [facility]   Humiliation, Afraid, Rape, and Kick questionnaire    Fear of Current or Ex-Partner: No    Emotionally Abused: No    Physically Abused: No    Sexually Abused: No    ROS Review of Systems  Constitutional:  Negative for chills and fever.  Eyes:  Negative for visual disturbance.  Respiratory:  Negative for chest tightness and shortness of breath.   Neurological:  Negative for dizziness and headaches.    Objective:   Today's Vitals: BP (!) 142/70   Pulse 88   Resp 16   Ht 5' (1.524 m)   Wt 260 lb 12.8 oz (118.3 kg)   SpO2 94%   BMI 50.93 kg/m   Physical Exam HENT:     Head: Normocephalic.     Mouth/Throat:     Mouth: Mucous membranes are moist.  Cardiovascular:     Rate and Rhythm:  Normal rate.     Heart sounds: Normal heart sounds.  Pulmonary:     Effort: Pulmonary effort is normal.     Breath sounds: Normal breath sounds.  Neurological:     Mental Status: She is alert.      Assessment & Plan:   Essential hypertension Assessment & Plan: Hypertension - Uncontrolled The patient's blood pressure is uncontrolled today in the clinic.  She reports taking losartan -hydrochlorothiazide  100-12.5 mg daily and amlodipine  10 mg daily. She is currently asymptomatic during today's visit. Her previous treatment regimen will be discontinued, and she will be started on Olmesartan-hydrochlorothiazide -amlodipine  40-12.5-10 mg daily. A low-sodium diet of less than 2,300 mg daily was recommended, along with moderate-intensity physical activity for at least 150 minutes per week. The patient was encouraged to maintain these lifestyle modifications to support better blood pressure control. Long-term considerations were discussed, emphasizing that uncontrolled hypertension increases the risk of stroke, coronary artery disease, and heart failure. The patient was instructed to seek emergency care if her blood pressure exceeds 180/120 mmHg and is accompanied by symptoms such as headaches, chest pain, palpitations, blurred vision, or dizziness. She verbalized understanding and will follow up as scheduled. BP Readings from Last 3 Encounters:  07/29/23 (!) 142/70  11/12/22 128/68  08/24/22 (!) 163/100       Orders: -     Olmesartan-amLODIPine -HCTZ; Take 1 tablet by mouth daily.  Dispense: 30 tablet; Refill: 1  Morbid obesity (HCC) Assessment & Plan: The patient reports that she has been implementing lifestyle modifications with minimal changes in her weight.  She declines a referral to a nutritionist today.  We discussed weight loss options, including injectable and oral medications.  Given that her blood pressure is not well controlled, we will defer starting oral weight loss  medications at this time.  She was advised to verify whether her insurance covers [phone]. For optimal results with weight loss, I recommend: Decreasing portion sizes. Reducing sugar, sodium, and carbohydrate intake, and limiting saturated fats in your diet. Increasing fiber intake by incorporating more whole grains, fruits, and vegetables. Setting healthy goals and focusing on lowering carbohydrate, sugar, and fat intake. Increasing the variety of fruits and vegetables in your diet. Reducing soda consumption and limiting processed foods. Engaging in moderate-intensity physical activity for at least 150 minutes per week for the best results.   Other hyperlipidemia -     Lipid panel -     CMP14+EGFR -     CBC with Differential/Platelet  IFG (  impaired fasting glucose) -     Hemoglobin A1c  Vitamin D deficiency -     VITAMIN D 25 Hydroxy (Vit-D Deficiency, Fractures)  Need for hepatitis C screening test -     Hepatitis C antibody  Encounter for screening for HIV -     HIV Antibody (routine testing w rflx)  TSH (thyroid-stimulating hormone deficiency) -     TSH + free T4  Note: This chart has been completed using Engineer, civil (consulting) software, and while attempts have been made to ensure accuracy, certain words and phrases may not be transcribed as intended.     Follow-up: Return in about 1 month (around 08/28/2023) for BP, obesity.   Debra Filice, FNP

## 2023-07-29 NOTE — Assessment & Plan Note (Signed)
 The patient reports that she has been implementing lifestyle modifications with minimal changes in her weight.  She declines a referral to a nutritionist today.  We discussed weight loss options, including injectable and oral medications.  Given that her blood pressure is not well controlled, we will defer starting oral weight loss medications at this time.  She was advised to verify whether her insurance covers [phone]. For optimal results with weight loss, I recommend: Decreasing portion sizes. Reducing sugar, sodium, and carbohydrate intake, and limiting saturated fats in your diet. Increasing fiber intake by incorporating more whole grains, fruits, and vegetables. Setting healthy goals and focusing on lowering carbohydrate, sugar, and fat intake. Increasing the variety of fruits and vegetables in your diet. Reducing soda consumption and limiting processed foods. Engaging in moderate-intensity physical activity for at least 150 minutes per week for the best results.

## 2023-07-29 NOTE — Patient Instructions (Addendum)
 I appreciate the opportunity to provide care to you today!    Follow up:  1 months for bP and weight  Labs: please stop by the lab today to get your blood drawn (CBC, CMP, TSH, Lipid profile, HgA1c, Vit D)  Screening: HIV and Hep C  Hypertension Management  Your current blood pressure is above the target goal of <140/90 mmHg. To address this, please start  taking Olmesartan- hydrochlorothiazide -amlodipine  40-12.5-10 mg daily    Medication Instructions: Take your blood pressure medication at the same time each day. After taking your medication, check your blood pressure at least an hour later. If your first reading is >140/90 mmHg, wait at least 10 minutes and recheck your blood pressure. Side Effects: In the initial days of therapy, you may experience dizziness or lightheadedness as your body adjusts to the lower blood pressure; this is expected. Diet and Lifestyle: Adhere to a low-sodium diet, limiting intake to less than 1500 mg daily, and increase your physical activity. Avoid over-the-counter NSAIDs such as ibuprofen and naproxen while on this medication. Hydration and Nutrition: Stay well-hydrated by drinking at least 64 ounces of water daily. Increase your servings of fruits and vegetables and avoid excessive sodium in your diet. Long-Term Considerations: Uncontrolled hypertension can increase the risk of cardiovascular diseases, including stroke, coronary artery disease, and heart failure.  Please report to the emergency department if your blood pressure exceeds 180/120 and is accompanied by symptoms such as headaches, chest pain, palpitations, blurred vision, or dizziness.   Weight Loss Please verify if your insurance covers [phone]. For optimal results with weight loss, I recommend: Decreasing portion sizes. Reducing sugar, sodium, and carbohydrate intake, and limiting saturated fats in your diet. Increasing fiber intake by incorporating more whole grains, fruits, and  vegetables. Setting healthy goals and focusing on lowering carbohydrate, sugar, and fat intake. Increasing the variety of fruits and vegetables in your diet. Reducing soda consumption and limiting processed foods. Engaging in moderate-intensity physical activity for at least 150 minutes per week for the best results.     Please continue to a heart-healthy diet and increase your physical activities. Try to exercise for at least five days a week.    It was a pleasure to see you and I look forward to continuing to work together on your health and well-being. Please do not hesitate to call the office if you need care or have questions about your care.  In case of emergency, please visit the Emergency Department for urgent care, or contact our clinic at [phone] to schedule an appointment. We're here to help you!   Have a wonderful day and week. With Gratitude, Charika Mikelson MSN, FNP-BC

## 2023-07-29 NOTE — Assessment & Plan Note (Addendum)
 Hypertension - Uncontrolled The patient's blood pressure is uncontrolled today in the clinic.  She reports taking losartan -hydrochlorothiazide  100-12.5 mg daily and amlodipine  10 mg daily. She is currently asymptomatic during today's visit. Her previous treatment regimen will be discontinued, and she will be started on Olmesartan-hydrochlorothiazide -amlodipine  40-12.5-10 mg daily. A low-sodium diet of less than 2,300 mg daily was recommended, along with moderate-intensity physical activity for at least 150 minutes per week. The patient was encouraged to maintain these lifestyle modifications to support better blood pressure control. Long-term considerations were discussed, emphasizing that uncontrolled hypertension increases the risk of stroke, coronary artery disease, and heart failure. The patient was instructed to seek emergency care if her blood pressure exceeds 180/120 mmHg and is accompanied by symptoms such as headaches, chest pain, palpitations, blurred vision, or dizziness. She verbalized understanding and will follow up as scheduled. BP Readings from Last 3 Encounters:  07/29/23 (!) 142/70  11/12/22 128/68  08/24/22 (!) 163/100

## 2023-07-30 ENCOUNTER — Other Ambulatory Visit: Payer: Self-pay | Admitting: Family Medicine

## 2023-07-30 DIAGNOSIS — E1165 Type 2 diabetes mellitus with hyperglycemia: Secondary | ICD-10-CM

## 2023-07-30 DIAGNOSIS — E1169 Type 2 diabetes mellitus with other specified complication: Secondary | ICD-10-CM

## 2023-07-30 LAB — LIPID PANEL
Chol/HDL Ratio: 7.6 ratio — ABNORMAL HIGH (ref 0.0–4.4)
Cholesterol, Total: 183 mg/dL (ref 100–199)
HDL: 24 mg/dL — ABNORMAL LOW (ref >39)
LDL Chol Calc (NIH): 146 mg/dL — ABNORMAL HIGH (ref 0–99)
Triglycerides: 70 mg/dL (ref 0–149)
VLDL Cholesterol Cal: 13 mg/dL (ref 5–40)

## 2023-07-30 LAB — CBC WITH DIFFERENTIAL/PLATELET
Basophils Absolute: 0 10*3/uL (ref 0.0–0.2)
Basos: 0 %
EOS (ABSOLUTE): 0.3 10*3/uL (ref 0.0–0.4)
Eos: 5 %
Hematocrit: 48.8 % — ABNORMAL HIGH (ref 34.0–46.6)
Hemoglobin: 15.6 g/dL (ref 11.1–15.9)
Immature Grans (Abs): 0 10*3/uL (ref 0.0–0.1)
Immature Granulocytes: 0 %
Lymphocytes Absolute: 2.4 10*3/uL (ref 0.7–3.1)
Lymphs: 38 %
MCH: 26.4 pg — ABNORMAL LOW (ref 26.6–33.0)
MCHC: 32 g/dL (ref 31.5–35.7)
MCV: 83 fL (ref 79–97)
Monocytes Absolute: 0.7 10*3/uL (ref 0.1–0.9)
Monocytes: 10 %
Neutrophils Absolute: 3 10*3/uL (ref 1.4–7.0)
Neutrophils: 47 %
Platelets: 227 10*3/uL (ref 150–450)
RBC: 5.91 x10E6/uL — ABNORMAL HIGH (ref 3.77–5.28)
RDW: 14.7 % (ref 11.7–15.4)
WBC: 6.5 10*3/uL (ref 3.4–10.8)

## 2023-07-30 LAB — TSH+FREE T4
Free T4: 1.23 ng/dL (ref 0.82–1.77)
TSH: 1.13 u[IU]/mL (ref 0.450–4.500)

## 2023-07-30 LAB — CMP14+EGFR
ALT: 31 IU/L (ref 0–32)
AST: 27 IU/L (ref 0–40)
Albumin: 4.7 g/dL (ref 3.8–4.9)
Alkaline Phosphatase: 25 IU/L — ABNORMAL LOW (ref 44–121)
BUN/Creatinine Ratio: 8 — ABNORMAL LOW (ref 9–23)
BUN: 7 mg/dL (ref 6–24)
Bilirubin Total: 0.5 mg/dL (ref 0.0–1.2)
CO2: 23 mmol/L (ref 20–29)
Calcium: 10.2 mg/dL (ref 8.7–10.2)
Chloride: 99 mmol/L (ref 96–106)
Creatinine, Ser: 0.9 mg/dL (ref 0.57–1.00)
Globulin, Total: 3.4 g/dL (ref 1.5–4.5)
Glucose: 84 mg/dL (ref 70–99)
Potassium: 4.3 mmol/L (ref 3.5–5.2)
Sodium: 139 mmol/L (ref 134–144)
Total Protein: 8.1 g/dL (ref 6.0–8.5)
eGFR: 75 mL/min/{1.73_m2} (ref >59)

## 2023-07-30 LAB — HEPATITIS C ANTIBODY: Hep C Virus Ab: NONREACTIVE

## 2023-07-30 LAB — HIV ANTIBODY (ROUTINE TESTING W REFLEX): HIV Screen 4th Generation wRfx: NONREACTIVE

## 2023-07-30 LAB — VITAMIN D 25 HYDROXY (VIT D DEFICIENCY, FRACTURES): Vit D, 25-Hydroxy: 87.3 ng/mL (ref 30.0–100.0)

## 2023-07-30 LAB — HEMOGLOBIN A1C
Est. average glucose Bld gHb Est-mCnc: 140 mg/dL
Hgb A1c MFr Bld: 6.5 % — ABNORMAL HIGH (ref 4.8–5.6)

## 2023-07-30 MED ORDER — TRULICITY 0.75 MG/0.5ML ~~LOC~~ SOAJ: 0.7500 mg | SUBCUTANEOUS | 0 refills | Status: DC

## 2023-07-30 MED ORDER — ROSUVASTATIN CALCIUM 10 MG PO TABS: 10.0000 mg | ORAL_TABLET | Freq: Every day | ORAL | 3 refills | Status: DC

## 2023-07-30 NOTE — Progress Notes (Signed)
 Please inform the patient that her hemoglobin A1c of 6.5 indicates that she is diabetic. A prescription for Trulicity has been sent to her pharmacy. This medication will help control her blood sugar and also assist with weight loss. Her cholesterol levels are elevated and not at goal; therefore, I have started her on rosuvastatin 10 mg daily and recommended that she implement lifestyle changes by decreasing her intake of greasy, fatty, and starchy foods, along with increasing her physical activity. Please also inform the patient to request monthly medication refills for Trulicity to allow for dose adjustments.

## 2023-08-14 ENCOUNTER — Other Ambulatory Visit: Payer: Self-pay | Admitting: Family Medicine

## 2023-08-14 DIAGNOSIS — E1165 Type 2 diabetes mellitus with hyperglycemia: Secondary | ICD-10-CM

## 2023-08-15 MED ORDER — TRULICITY 0.75 MG/0.5ML ~~LOC~~ SOAJ: 0.7500 mg | SUBCUTANEOUS | 0 refills | Status: DC

## 2023-08-23 ENCOUNTER — Ambulatory Visit: Admitting: Family Medicine

## 2023-08-23 ENCOUNTER — Encounter: Payer: Self-pay | Admitting: Family Medicine

## 2023-08-23 VITALS — BP 170/99 | HR 92 | Ht 60.0 in | Wt 259.0 lb

## 2023-08-23 DIAGNOSIS — I10 Essential (primary) hypertension: Secondary | ICD-10-CM

## 2023-08-23 DIAGNOSIS — R52 Pain, unspecified: Secondary | ICD-10-CM

## 2023-08-23 MED ORDER — OXYCODONE-ACETAMINOPHEN 5-325 MG PO TABS: 1.0000 | ORAL_TABLET | Freq: Four times a day (QID) | ORAL | 0 refills | Status: AC | PRN

## 2023-08-23 NOTE — Patient Instructions (Addendum)
 I appreciate the opportunity to provide care to you today!    Follow up:  2 weeks Nurse Visit for BP  Labs: please stop by the lab today to get your blood drawn (BMP)  - continue your current regimen as as - you may take percocet 5-325 mg every 6 hours as needed for pain >7  Attached with your AVS, you will find valuable resources for self-education. I highly recommend dedicating some time to thoroughly examine them.   Please continue to a heart-healthy diet and increase your physical activities. Try to exercise for at least five days a week.    It was a pleasure to see you and I look forward to continuing to work together on your health and well-being. Please do not hesitate to call the office if you need care or have questions about your care.  In case of emergency, please visit the Emergency Department for urgent care, or contact our clinic at [phone] to schedule an appointment. We're here to help you!   Have a wonderful day and week. With Gratitude, Dorion Petillo MSN, FNP-BC

## 2023-08-23 NOTE — Progress Notes (Signed)
 Established Patient Office Visit  Subjective:  Patient ID: Debra Mendoza, female    DOB: 1965/04/06  Age: 58 y.o. MRN: [id number]  CC:  Chief Complaint  Patient presents with   Medical Management of Chronic Issues    Follow up, reports low back pain due to fibroids.     HPI Debra Mendoza is a 58 y.o. female  presents for BP f/u.  For the details of today's visit, please refer to the assessment and plan.   Pain: The patient presents with complaints of low back pain, which she rates as 10/10 over the past several days. She attributes the pain to a flare-up of her uterine fibroids. The pain is described as aching and constant. She has been taking over-the-counter Tylenol, but reports minimal relief. She denies any red flag symptoms, including numbness, weakness, bowel or bladder dysfunction, or unexplained weight loss.        5-6 and 2 are every large, waiting to get healthy before surgery    Past Medical History:  Diagnosis Date   Asthma 2018   Cataract 2024   Clotting disorder (HCC) 2025   Fibroids   GERD (gastroesophageal reflux disease) 2024   Hypertension     Past Surgical History:  Procedure Laterality Date   IR RADIOLOGIST EVAL & MGMT  04/09/2017   MYOMECTOMY     NOVASURE ABLATION      Family History  Problem Relation Age of Onset   Hypertension Mother    Hypertension Father    Heart disease Father    Hypertension Maternal Grandmother    Breast cancer Paternal Aunt    Breast cancer Maternal Aunt    Colon cancer Neg Hx     Social History   Socioeconomic History   Marital status: Married    Spouse name: Not on file   Number of children: Not on file   Years of education: Not on file   Highest education level: 12th grade  Occupational History   Not on file  Tobacco Use   Smoking status: Never   Smokeless tobacco: Never  Substance and Sexual Activity   Alcohol use: Yes    Alcohol/week: 1.0 standard drink of alcohol    Types: 1 Glasses of  wine per week    Comment: occassional    Drug use: No   Sexual activity: Yes    Birth control/protection: None  Other Topics Concern   Not on file  Social History Narrative   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    Social Drivers of Health   Financial Resource Strain: Low Risk  (07/25/2023)   Overall Financial Resource Strain (CARDIA)    Difficulty of Paying Living Expenses: Not hard at all  Food Insecurity: No Food Insecurity (07/25/2023)   Hunger Vital Sign    Worried About Running Out of Food in the Last Year: Never true    Ran Out of Food in the Last Year: Never true  Transportation Needs: No Transportation Needs (07/25/2023)   PRAPARE - Administrator, Civil Service (Medical): No    Lack of Transportation (Non-Medical): No  Physical Activity: Unknown (07/25/2023)   Exercise Vital Sign    Days of Exercise per Week: 0 days    Minutes of Exercise per Session: Not on file  Stress: No Stress Concern Present (07/25/2023)   [employer] of Occupational Health - Occupational Stress Questionnaire    Feeling of Stress : Only a little  Social Connections: Socially Isolated (07/25/2023)  Social Advertising account executive [NHANES]    Frequency of Communication with Friends and Family: Once a week    Frequency of Social Gatherings with Friends and Family: Never    Attends Religious Services: Never    Database administrator or Organizations: No    Attends Engineer, structural: Not on file    Marital Status: Married  Catering manager Violence: Not At Risk (04/13/2022)   Received from [facility], [facility]   Humiliation, Afraid, Rape, and Kick questionnaire    Fear of Current or Ex-Partner: No    Emotionally Abused: No    Physically Abused: No    Sexually Abused: No    Outpatient Medications Prior to Visit  Medication Sig Dispense Refill   albuterol  (VENTOLIN  HFA) 108 (90 Base) MCG/ACT inhaler Inhale 2 puffs into the lungs every 6 (six) hours as  needed for wheezing or shortness of breath. 8 g 3   budesonide -formoterol  (SYMBICORT ) 80-4.5 MCG/ACT inhaler INHALE 2 PUFFS BY MOUTH INTO THE LUNGS DAILY 10.2 g 5   Dulaglutide (TRULICITY) 0.75 MG/0.5ML SOAJ Inject 0.75 mg into the skin once a week. 2 mL 0   norethindrone  (AYGESTIN ) 5 MG tablet Take 5 mg by mouth 2 (two) times daily.     Olmesartan -amLODIPine -HCTZ 40-10-12.5 MG TABS Take 1 tablet by mouth daily. 30 tablet 1   rosuvastatin  (CRESTOR ) 10 MG tablet Take 1 tablet (10 mg total) by mouth daily. 90 tablet 3   Spacer/Aero-Holding Chambers (EQ SPACE CHAMBER ANTI-STATIC) DEVI See admin instructions. use with inhaler     No facility-administered medications prior to visit.    No Known Allergies  ROS Review of Systems  Constitutional:  Negative for chills and fever.  Eyes:  Negative for visual disturbance.  Respiratory:  Negative for chest tightness and shortness of breath.   Musculoskeletal:  Positive for back pain.  Neurological:  Negative for dizziness and headaches.      Objective:     Physical Exam HENT:     Head: Normocephalic.     Mouth/Throat:     Mouth: Mucous membranes are moist.  Cardiovascular:     Rate and Rhythm: Normal rate.     Heart sounds: Normal heart sounds.  Pulmonary:     Effort: Pulmonary effort is normal.     Breath sounds: Normal breath sounds.  Neurological:     Mental Status: She is alert.     BP (!) 170/99 (BP Location: Left Arm) Comment: Simultaneous filing. User may not have seen previous data.  Pulse 92   Ht 5' (1.524 m)   Wt 259 lb 0.6 oz (117.5 kg)   SpO2 96%   BMI 50.59 kg/m  Wt Readings from Last 3 Encounters:  08/23/23 259 lb 0.6 oz (117.5 kg)  07/29/23 260 lb 12.8 oz (118.3 kg)  11/12/22 264 lb 12.8 oz (120.1 kg)    Lab Results  Component Value Date   TSH 1.130 07/29/2023   Lab Results  Component Value Date   WBC 6.5 07/29/2023   HGB 15.6 07/29/2023   HCT 48.8 (H) 07/29/2023   MCV 83 07/29/2023   PLT 227  07/29/2023   Lab Results  Component Value Date   NA 139 07/29/2023   K 4.3 07/29/2023   CO2 23 07/29/2023   GLUCOSE 84 07/29/2023   BUN 7 07/29/2023   CREATININE 0.90 07/29/2023   BILITOT 0.5 07/29/2023   ALKPHOS 25 (L) 07/29/2023   AST 27 07/29/2023   ALT  31 07/29/2023   PROT 8.1 07/29/2023   ALBUMIN 4.7 07/29/2023   CALCIUM  10.2 07/29/2023   EGFR 75 07/29/2023   Lab Results  Component Value Date   CHOL 183 07/29/2023   Lab Results  Component Value Date   HDL 24 (L) 07/29/2023   Lab Results  Component Value Date   LDLCALC 146 (H) 07/29/2023   Lab Results  Component Value Date   TRIG 70 07/29/2023   Lab Results  Component Value Date   CHOLHDL 7.6 (H) 07/29/2023   Lab Results  Component Value Date   HGBA1C 6.5 (H) 07/29/2023      Assessment & Plan:  Essential hypertension Assessment & Plan: Hypertension - Uncontrolled, likely secondary to acute pain: Patient is currently asymptomatic during today's visit. No changes made to the current antihypertensive regimen at this time. Encouraged to continue taking Olmesartan -Hydrochlorothiazide -Amlodipine  40-12.5-10 mg daily as prescribed. Plan to follow up in 2 weeks for a nurse visit to reassess blood pressure and response to pain management. A low-sodium diet of less than 2,300 mg daily was recommended, along with moderate-intensity physical activity for at least 150 minutes per week. The patient was encouraged to maintain these lifestyle modifications to support better blood pressure control. Long-term considerations were discussed, emphasizing that uncontrolled hypertension increases the risk of stroke, coronary artery disease, and heart failure. The patient was instructed to seek emergency care if her blood pressure exceeds 180/120 mmHg and is accompanied by symptoms such as headaches, chest pain, palpitations, blurred vision, or dizziness. She verbalized understanding and will follow up as scheduled. BP Readings  from Last 3 Encounters:  08/23/23 (!) 170/99  07/29/23 (!) 142/70  11/12/22 128/68       Orders: -     BMP8+EGFR  Pain Assessment & Plan: Prescribed Percocet 5/325 mg (acetaminophen/oxycodone) to be taken every 6 hours as needed for pain relief. Encourage application of heat to the lower back to help alleviate muscle tension and discomfort. Recommend adequate rest and avoidance of aggravating activities that may worsen the pain. Patient advised to monitor for any side effects from medication use, such as sedation, constipation, or signs of dependency. Follow-up as needed if pain persists or worsens, or if additional symptoms develop.    Orders: -     oxyCODONE-Acetaminophen; Take 1 tablet by mouth every 6 (six) hours as needed for up to 5 days for severe pain (pain score 7-10).  Dispense: 15 tablet; Refill: 0  Note: This chart has been completed using Engineer, civil (consulting) software, and while attempts have been made to ensure accuracy, certain words and phrases may not be transcribed as intended.    Follow-up: Return in about 2 weeks (around 09/06/2023) for BP.   Dustin Burrill, FNP

## 2023-08-23 NOTE — Assessment & Plan Note (Signed)
 Prescribed Percocet 5/325 mg (acetaminophen/oxycodone) to be taken every 6 hours as needed for pain relief. Encourage application of heat to the lower back to help alleviate muscle tension and discomfort. Recommend adequate rest and avoidance of aggravating activities that may worsen the pain. Patient advised to monitor for any side effects from medication use, such as sedation, constipation, or signs of dependency. Follow-up as needed if pain persists or worsens, or if additional symptoms develop.

## 2023-08-23 NOTE — Assessment & Plan Note (Signed)
 Hypertension - Uncontrolled, likely secondary to acute pain: Patient is currently asymptomatic during today's visit. No changes made to the current antihypertensive regimen at this time. Encouraged to continue taking Olmesartan -Hydrochlorothiazide -Amlodipine  40-12.5-10 mg daily as prescribed. Plan to follow up in 2 weeks for a nurse visit to reassess blood pressure and response to pain management. A low-sodium diet of less than 2,300 mg daily was recommended, along with moderate-intensity physical activity for at least 150 minutes per week. The patient was encouraged to maintain these lifestyle modifications to support better blood pressure control. Long-term considerations were discussed, emphasizing that uncontrolled hypertension increases the risk of stroke, coronary artery disease, and heart failure. The patient was instructed to seek emergency care if her blood pressure exceeds 180/120 mmHg and is accompanied by symptoms such as headaches, chest pain, palpitations, blurred vision, or dizziness. She verbalized understanding and will follow up as scheduled. BP Readings from Last 3 Encounters:  08/23/23 (!) 170/99  07/29/23 (!) 142/70  11/12/22 128/68

## 2023-08-24 ENCOUNTER — Ambulatory Visit: Payer: Self-pay | Admitting: Family Medicine

## 2023-08-24 LAB — BMP8+EGFR
BUN/Creatinine Ratio: 9 (ref 9–23)
BUN: 8 mg/dL (ref 6–24)
CO2: 22 mmol/L (ref 20–29)
Calcium: 9.8 mg/dL (ref 8.7–10.2)
Chloride: 98 mmol/L (ref 96–106)
Creatinine, Ser: 0.94 mg/dL (ref 0.57–1.00)
Glucose: 72 mg/dL (ref 70–99)
Potassium: 4.7 mmol/L (ref 3.5–5.2)
Sodium: 141 mmol/L (ref 134–144)
eGFR: 71 mL/min/{1.73_m2} (ref >59)

## 2023-08-29 ENCOUNTER — Telehealth: Payer: Self-pay

## 2023-08-29 NOTE — Telephone Encounter (Signed)
 Copied from CRM [id number]. Topic: Clinical - Prescription Issue >> Aug 28, 2023  4:54 PM Phil Braun wrote: Reason for CRM:   Dulaglutide (TRULICITY) 0.75 MG/0.5ML SOAJ  Pt called today and said that her ins will not let her pick up until June 3rd. She took last inj on May 21st and she wants to know if that will hurt her in any way. Please advise. [phone]

## 2023-09-06 ENCOUNTER — Ambulatory Visit

## 2023-09-06 ENCOUNTER — Other Ambulatory Visit: Payer: Self-pay | Admitting: Family Medicine

## 2023-09-06 DIAGNOSIS — E1165 Type 2 diabetes mellitus with hyperglycemia: Secondary | ICD-10-CM

## 2023-09-06 MED ORDER — TRULICITY 1.5 MG/0.5ML ~~LOC~~ SOAJ: 1.5000 mg | SUBCUTANEOUS | 0 refills | Status: DC

## 2023-09-06 NOTE — Progress Notes (Unsigned)
trulici

## 2023-09-13 ENCOUNTER — Ambulatory Visit

## 2023-09-13 NOTE — Progress Notes (Signed)
 Patient is in office today for a nurse visit for Blood Pressure Check. Patient blood pressure was 163/96, Patient No chest pain, No shortness of breath, No dyspnea on exertion, No orthopnea, No paroxysmal nocturnal dyspnea, No edema, No palpitations, No syncope Patient did not want to wait five minutes to have blood pressure rechecked

## 2023-09-16 ENCOUNTER — Other Ambulatory Visit: Payer: Self-pay | Admitting: Family Medicine

## 2023-09-16 DIAGNOSIS — I10 Essential (primary) hypertension: Secondary | ICD-10-CM

## 2023-09-16 MED ORDER — OLMESARTAN-AMLODIPINE-HCTZ 40-10-25 MG PO TABS: 1.0000 | ORAL_TABLET | Freq: Every day | ORAL | 1 refills | Status: DC

## 2023-09-23 LAB — HM DIABETES EYE EXAM

## 2023-10-11 ENCOUNTER — Ambulatory Visit: Admitting: Family Medicine

## 2023-10-21 ENCOUNTER — Ambulatory Visit: Admitting: Family Medicine

## 2023-10-29 ENCOUNTER — Encounter: Payer: Self-pay | Admitting: Family Medicine

## 2023-11-05 ENCOUNTER — Telehealth: Payer: Self-pay

## 2023-11-05 NOTE — Telephone Encounter (Signed)
 Copied from CRM #[id number]. Topic: Clinical - Medical Advice >> Nov 04, 2023  2:16 PM Delon HERO wrote: Reason for CRM: Patient is calling to report that she is sent a MyChart Message for surgical clearance on 10/29/2023 and did not receive a response back. Agent conacted CAL to see if an appointment was needed. The soonest office visit with Meade is February 05, 2024. Please advise with the patient

## 2023-11-05 NOTE — Telephone Encounter (Signed)
 Responded to patient's MyChart message in regard to message below. Patient is scheduled for 10/13, which is soonest available right now but she is placed on wait list.

## 2023-11-05 NOTE — Telephone Encounter (Signed)
 Pls schedule a surgical clearance with available provider. Let pt know she will need to bring the clearance form if she has one

## 2023-11-07 ENCOUNTER — Other Ambulatory Visit: Payer: Self-pay | Admitting: Family Medicine

## 2023-11-07 DIAGNOSIS — E1165 Type 2 diabetes mellitus with hyperglycemia: Secondary | ICD-10-CM

## 2023-11-14 ENCOUNTER — Other Ambulatory Visit: Payer: Self-pay | Admitting: Family Medicine

## 2023-11-14 DIAGNOSIS — I10 Essential (primary) hypertension: Secondary | ICD-10-CM

## 2023-11-14 MED ORDER — OLMESARTAN-AMLODIPINE-HCTZ 40-10-25 MG PO TABS: 1.0000 | ORAL_TABLET | Freq: Every day | ORAL | 1 refills | Status: DC

## 2023-11-14 NOTE — Telephone Encounter (Signed)
 Copied from CRM [id number]. Topic: Clinical - Medication Refill >> Nov 14, 2023 10:01 AM Berwyn MATSU wrote: Medication: Olmesartan -amLODIPine -HCTZ 40-10-25 MG TABS  Has the patient contacted their pharmacy? Yes (Agent: If no, request that the patient contact the pharmacy for the refill. If patient does not wish to contact the pharmacy document the reason why and proceed with request.) (Agent: If yes, when and what did the pharmacy advise?)  This is the patient's preferred pharmacy:  [id number] - [place], [place] - [address] [facility] [address] [place] [address] Phone: [phone] Fax: [phone]  Is this the correct pharmacy for this prescription? Yes If no, delete pharmacy and type the correct one.   Has the prescription been filled recently? Yes  Is the patient out of the medication? Yes  Has the patient been seen for an appointment in the last year OR does the patient have an upcoming appointment? Yes  Can we respond through MyChart? Yes  Agent: Please be advised that Rx refills may take up to 3 business days. We ask that you follow-up with your pharmacy.

## 2023-12-14 ENCOUNTER — Ambulatory Visit: Payer: Self-pay | Admitting: Family Medicine

## 2023-12-14 ENCOUNTER — Other Ambulatory Visit: Payer: Self-pay | Admitting: Family Medicine

## 2023-12-14 DIAGNOSIS — E11319 Type 2 diabetes mellitus with unspecified diabetic retinopathy without macular edema: Secondary | ICD-10-CM

## 2024-01-13 ENCOUNTER — Other Ambulatory Visit: Payer: Self-pay | Admitting: Family Medicine

## 2024-01-13 ENCOUNTER — Encounter: Payer: Self-pay | Admitting: Family Medicine

## 2024-01-13 ENCOUNTER — Ambulatory Visit (HOSPITAL_COMMUNITY)
Admission: RE | Admit: 2024-01-13 | Discharge: 2024-01-13 | Disposition: A | Discharge status: Home / Self Care | Source: Ambulatory Visit | Attending: Family Medicine | Admitting: Family Medicine

## 2024-01-13 ENCOUNTER — Ambulatory Visit: Admitting: Family Medicine

## 2024-01-13 VITALS — BP 148/82 | HR 83 | Resp 18 | Ht 60.0 in | Wt 257.1 lb

## 2024-01-13 DIAGNOSIS — I10 Essential (primary) hypertension: Secondary | ICD-10-CM | POA: Diagnosis not present

## 2024-01-13 DIAGNOSIS — R7301 Impaired fasting glucose: Secondary | ICD-10-CM

## 2024-01-13 DIAGNOSIS — E559 Vitamin D deficiency, unspecified: Secondary | ICD-10-CM

## 2024-01-13 DIAGNOSIS — Z23 Encounter for immunization: Secondary | ICD-10-CM | POA: Diagnosis not present

## 2024-01-13 DIAGNOSIS — Z0001 Encounter for general adult medical examination with abnormal findings: Secondary | ICD-10-CM

## 2024-01-13 DIAGNOSIS — E1169 Type 2 diabetes mellitus with other specified complication: Secondary | ICD-10-CM

## 2024-01-13 DIAGNOSIS — E782 Mixed hyperlipidemia: Secondary | ICD-10-CM | POA: Diagnosis not present

## 2024-01-13 DIAGNOSIS — E038 Other specified hypothyroidism: Secondary | ICD-10-CM

## 2024-01-13 DIAGNOSIS — Z01818 Encounter for other preprocedural examination: Secondary | ICD-10-CM | POA: Diagnosis present

## 2024-01-13 LAB — URINALYSIS
Bilirubin, UA: NEGATIVE
Glucose, UA: NEGATIVE
Ketones, UA: NEGATIVE
Leukocytes,UA: NEGATIVE
Nitrite, UA: NEGATIVE
Protein,UA: NEGATIVE
RBC, UA: NEGATIVE
Specific Gravity, UA: 1.015 (ref 1.005–1.030)
Urobilinogen, Ur: 0.2 mg/dL (ref 0.2–1.0)
pH, UA: 5.5 (ref 5.0–7.5)

## 2024-01-13 MED ORDER — ROSUVASTATIN CALCIUM 10 MG PO TABS: 10.0000 mg | ORAL_TABLET | Freq: Every day | ORAL | 3 refills | Status: AC

## 2024-01-13 MED ORDER — OLMESARTAN-AMLODIPINE-HCTZ 40-10-25 MG PO TABS: 1.0000 | ORAL_TABLET | Freq: Every day | ORAL | 1 refills | Status: AC

## 2024-01-13 NOTE — Progress Notes (Signed)
 Complete physical exam  Patient: Debra Mendoza   DOB: Aug 22, 1965   58 y.o. Female  MRN: [id number]  Subjective:    Chief Complaint  Patient presents with   Annual Exam    Cpe and surgical clearance for hysterectomy     Debra Mendoza is a 58 y.o. female who presents today for a complete physical exam. She reports consuming a general diet. The patient does not participate in regular exercise at present. She generally feels well. She reports sleeping well. She does not have additional problems to discuss today.    Most recent fall risk assessment:    01/13/2024    8:48 AM  Fall Risk   Falls in the past year? 0  Number falls in past yr: 0  Injury with Fall? 0  Follow up Falls evaluation completed     Most recent depression screenings:    01/13/2024    8:48 AM 08/23/2023   11:09 AM  PHQ 2/9 Scores  PHQ - 2 Score 3 0  PHQ- 9 Score 10 0    Dental: No current dental problems and No regular dental care   Patient Active Problem List   Diagnosis Date Noted   Encounter for general adult medical examination with abnormal findings 01/13/2024   Preoperative clearance 01/13/2024   Encounter for immunization 01/13/2024   Pain 08/23/2023   DOE (dyspnea on exertion) 08/24/2022   Prediabetes 11/11/2018   Hyperlipidemia 11/11/2018   Morbid obesity (HCC) 06/20/2017   Moderate asthma without complication 03/08/2016   Essential hypertension, benign 03/08/2016   Numbness and tingling of left leg 11/04/2014   Essential hypertension 07/09/2011   Obesity 07/09/2011   Past Medical History:  Diagnosis Date   Asthma 2018   Cataract 2024   Clotting disorder 2025   Fibroids   GERD (gastroesophageal reflux disease) 2024   Hypertension 1999   Past Surgical History:  Procedure Laterality Date   IR RADIOLOGIST EVAL & MGMT  04/09/2017   MYOMECTOMY     NOVASURE ABLATION     Social History   Tobacco Use   Smoking status: Never   Smokeless tobacco: Never  Substance Use  Topics   Alcohol use: Yes    Alcohol/week: 1.0 standard drink of alcohol    Types: 1 Glasses of wine per week    Comment: occassional    Drug use: No   Social History   Socioeconomic History   Marital status: Married    Spouse name: Not on file   Number of children: Not on file   Years of education: Not on file   Highest education level: 12th grade  Occupational History   Not on file  Tobacco Use   Smoking status: Never   Smokeless tobacco: Never  Substance and Sexual Activity   Alcohol use: Yes    Alcohol/week: 1.0 standard drink of alcohol    Types: 1 Glasses of wine per week    Comment: occassional    Drug use: No   Sexual activity: Yes    Birth control/protection: None  Other Topics Concern   Not on file  Social History Narrative   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    Social Drivers of Health   Financial Resource Strain: Low Risk  (01/06/2024)   Overall Financial Resource Strain (CARDIA)    Difficulty of Paying Living Expenses: Not hard at all  Food Insecurity: No Food Insecurity (01/06/2024)   Hunger Vital Sign    Worried About Running Out of  Food in the Last Year: Never true    Ran Out of Food in the Last Year: Never true  Transportation Needs: No Transportation Needs (01/06/2024)   PRAPARE - Administrator, Civil Service (Medical): No    Lack of Transportation (Non-Medical): No  Physical Activity: Inactive (01/06/2024)   Exercise Vital Sign    Days of Exercise per Week: 0 days    Minutes of Exercise per Session: Not on file  Stress: Stress Concern Present (01/06/2024)   [employer] of Occupational Health - Occupational Stress Questionnaire    Feeling of Stress: Rather much  Social Connections: Socially Isolated (01/06/2024)   Social Connection and Isolation Panel    Frequency of Communication with Friends and Family: Once a week    Frequency of Social Gatherings with Friends and Family: Once a week    Attends Religious Services: Never     Database administrator or Organizations: No    Attends Engineer, structural: Not on file    Marital Status: Married  Catering manager Violence: Not At Risk (04/13/2022)   Received from [facility]   Humiliation, Afraid, Rape, and Kick questionnaire    Within the last year, have you been afraid of your partner or ex-partner?: No    Within the last year, have you been humiliated or emotionally abused in other ways by your partner or ex-partner?: No    Within the last year, have you been kicked, hit, slapped, or otherwise physically hurt by your partner or ex-partner?: No    Within the last year, have you been raped or forced to have any kind of sexual activity by your partner or ex-partner?: No   Family Status  Relation Name Status   Mother Mamie Deceased   Father Charlie Deceased   MGM Gabriel Deceased at age 59   Brother  Alive   MGF  Deceased at age 38   PGM  Deceased   PGF  Deceased   Oceanographer  (Not Specified)   Mat Aunt  (Not Specified)   Neg Hx  (Not Specified)  No partnership data on file   Family History  Problem Relation Age of Onset   Hypertension Mother    Hypertension Father    Heart disease Father    Hypertension Maternal Grandmother    Breast cancer Paternal Aunt    Breast cancer Maternal Aunt    Colon cancer Neg Hx    No Known Allergies    Patient Care Team: Bacchus, Meade PEDLAR, FNP as PCP - General (Family Medicine)   Outpatient Medications Prior to Visit  Medication Sig   albuterol  (VENTOLIN  HFA) 108 (90 Base) MCG/ACT inhaler Inhale 2 puffs into the lungs every 6 (six) hours as needed for wheezing or shortness of breath.   budesonide -formoterol  (SYMBICORT ) 80-4.5 MCG/ACT inhaler INHALE 2 PUFFS BY MOUTH INTO THE LUNGS DAILY   Dulaglutide  (TRULICITY ) 1.5 MG/0.5ML SOAJ INJECT 1.5 MG SUBCUTANEOUSLY ONCE A WEEK   norethindrone  (AYGESTIN ) 5 MG tablet Take 5 mg by mouth 2 (two) times daily.   Spacer/Aero-Holding Chambers (EQ SPACE CHAMBER ANTI-STATIC)  DEVI See admin instructions. use with inhaler   [DISCONTINUED] Olmesartan -amLODIPine -HCTZ 40-10-25 MG TABS Take 1 tablet by mouth daily.   [DISCONTINUED] rosuvastatin  (CRESTOR ) 10 MG tablet Take 1 tablet (10 mg total) by mouth daily.   No facility-administered medications prior to visit.    Review of Systems  Constitutional:  Negative for chills, fever and malaise/fatigue.  HENT:  Negative for congestion  and sinus pain.   Eyes:  Negative for pain, discharge and redness.  Respiratory:  Negative for cough, sputum production and shortness of breath.   Cardiovascular:  Negative for chest pain, palpitations, claudication and leg swelling.  Gastrointestinal:  Negative for diarrhea, heartburn and nausea.  Genitourinary:  Negative for flank pain and frequency.  Musculoskeletal:  Negative for back pain and joint pain.  Skin:  Negative for itching.  Neurological:  Negative for dizziness, seizures and headaches.  Endo/Heme/Allergies:  Negative for environmental allergies.  Psychiatric/Behavioral:  Negative for memory loss. The patient does not have insomnia.        Objective:    BP (!) 148/82   Pulse 83   Resp 18   Ht 5' (1.524 m)   Wt 257 lb 1.9 oz (116.6 kg)   SpO2 95%   BMI 50.22 kg/m  BP Readings from Last 3 Encounters:  01/13/24 (!) 148/82  09/13/23 (!) 163/96  08/23/23 (!) 170/99   Wt Readings from Last 3 Encounters:  01/13/24 257 lb 1.9 oz (116.6 kg)  08/23/23 259 lb 0.6 oz (117.5 kg)  07/29/23 260 lb 12.8 oz (118.3 kg)      Physical Exam HENT:     Head: Normocephalic.     Left Ear: External ear normal.     Mouth/Throat:     Mouth: Mucous membranes are moist.  Eyes:     Extraocular Movements: Extraocular movements intact.     Pupils: Pupils are equal, round, and reactive to light.  Cardiovascular:     Rate and Rhythm: Normal rate and regular rhythm.     Heart sounds: No murmur heard. Pulmonary:     Effort: Pulmonary effort is normal.     Breath sounds: Normal  breath sounds.  Abdominal:     Palpations: Abdomen is soft.     Tenderness: There is no right CVA tenderness or left CVA tenderness.  Musculoskeletal:     Right lower leg: No edema.     Left lower leg: No edema.  Skin:    Findings: No lesion or rash.  Neurological:     Mental Status: She is alert and oriented to person, place, and time.     GCS: GCS eye subscore is 4. GCS verbal subscore is 5. GCS motor subscore is 6.     Cranial Nerves: No facial asymmetry.     Sensory: No sensory deficit.     Motor: No weakness.     Coordination: Coordination normal. Finger-Nose-Finger Test normal.     Gait: Gait normal.  Psychiatric:        Judgment: Judgment normal.     No results found for any visits on 01/13/24. Last CBC Lab Results  Component Value Date   WBC 6.5 07/29/2023   HGB 15.6 07/29/2023   HCT 48.8 (H) 07/29/2023   MCV 83 07/29/2023   MCH 26.4 (L) 07/29/2023   RDW 14.7 07/29/2023   PLT 227 07/29/2023   Last metabolic panel Lab Results  Component Value Date   GLUCOSE 72 08/23/2023   NA 141 08/23/2023   K 4.7 08/23/2023   CL 98 08/23/2023   CO2 22 08/23/2023   BUN 8 08/23/2023   CREATININE 0.94 08/23/2023   EGFR 71 08/23/2023   CALCIUM  9.8 08/23/2023   PROT 8.1 07/29/2023   ALBUMIN 4.7 07/29/2023   LABGLOB 3.4 07/29/2023   AGRATIO 1.8 11/13/2019   BILITOT 0.5 07/29/2023   ALKPHOS 25 (L) 07/29/2023   AST 27 07/29/2023   ALT  31 07/29/2023   Last lipids Lab Results  Component Value Date   CHOL 183 07/29/2023   HDL 24 (L) 07/29/2023   LDLCALC 146 (H) 07/29/2023   TRIG 70 07/29/2023   CHOLHDL 7.6 (H) 07/29/2023   Last hemoglobin A1c Lab Results  Component Value Date   HGBA1C 6.5 (H) 07/29/2023   Last thyroid functions Lab Results  Component Value Date   TSH 1.130 07/29/2023   Last vitamin D  Lab Results  Component Value Date   VD25OH 87.3 07/29/2023   Last vitamin B12 and Folate No results found for: VITAMINB12, FOLATE      Assessment &  Plan:    Routine Health Maintenance and Physical Exam  Immunization History  Administered Date(s) Administered   Influenza, Seasonal, Injecte, Preservative Fre 01/13/2024   Influenza,inj,Quad PF,6+ Mos 01/03/2013, 12/26/2013, 01/27/2015, 01/28/2016, 01/01/2017, 12/18/2017, 12/01/2018, 12/25/2019   Moderna Sars-Covid-2 Vaccination 05/29/2019, 06/24/2019, 03/21/2020   Tdap 12/31/2011, 01/13/2024   Zoster Recombinant(Shingrix ) 12/12/2018, 02/12/2019    Health Maintenance  Topic Date Due   Hepatitis B Vaccines 19-59 Average Risk (1 of 3 - 19+ 3-dose series) Never done   Diabetic kidney evaluation - Urine ACR  12/24/2020   Mammogram  11/22/2021   COVID-19 Vaccine (4 - 2025-26 season) 12/02/2023   Cervical Cancer Screening (HPV/Pap Cotest)  12/12/2023   Diabetic kidney evaluation - eGFR measurement  08/22/2024   Fecal DNA (Cologuard)  01/07/2025   DTaP/Tdap/Td (3 - Td or Tdap) 01/12/2034   Pneumococcal Vaccine: 50+ Years  Completed   Influenza Vaccine  Completed   Hepatitis C Screening  Completed   HIV Screening  Completed   Zoster Vaccines- Shingrix   Completed   HPV VACCINES  Aged Out   Meningococcal B Vaccine  Aged Out   Colonoscopy  Discontinued    Discussed health benefits of physical activity, and encouraged her to engage in regular exercise appropriate for her age and condition.  Encounter for general adult medical examination with abnormal findings Assessment & Plan: Physical exam as documented Discussed heart-healthy diet  Encouraged to Exercise: If you are able: 30 -60 minutes a day ,4 days a week, or 150 minutes a week. The longer the better. Combine stretch, strength, and aerobic activities Encourage to eat whole Food, Plant Predominant Nutrition is highly recommended: Eat Plenty of vegetables, Mushrooms, fruits, Legumes, Whole Grains, Nuts, seeds in lieu of processed meats, processed snacks/pastries red meat, poultry, eggs.  Will f/u in 1 year for CPE       Encounter for immunization Assessment & Plan: Patient educated on CDC recommendation for the vaccine. Verbal consent was obtained from the patient, vaccine administered by nurse, no sign of adverse reactions noted at this time. Patient education on arm soreness and use of tylenol  or ibuprofen for this patient  was discussed. Patient educated on the signs and symptoms of adverse effect and advise to contact the office if they occur.   Orders: -     Flu vaccine trivalent PF, 6mos and older(Flulaval,Afluria,Fluarix,Fluzone) -     Tdap vaccine greater than or equal to 7yo IM  Preoperative clearance Assessment & Plan: Pending labs  Orders: -     EKG 12-Lead -     DG Chest 2 View -     Urinalysis  Essential hypertension -     Olmesartan -amLODIPine -HCTZ; Take 1 tablet by mouth daily.  Dispense: 90 tablet; Refill: 1 -     Microalbumin / creatinine urine ratio  Hyperlipidemia associated with type 2 diabetes mellitus (HCC) -  Rosuvastatin  Calcium ; Take 1 tablet (10 mg total) by mouth daily.  Dispense: 90 tablet; Refill: 3  IFG (impaired fasting glucose) -     Hemoglobin A1c  Vitamin D  deficiency -     VITAMIN D  25 Hydroxy (Vit-D Deficiency, Fractures)  TSH (thyroid-stimulating hormone deficiency) -     TSH + free T4  Mixed hyperlipidemia -     Lipid panel -     CMP14+EGFR -     CBC with Differential/Platelet  Note: This chart has been completed using Engineer, civil (consulting) software, and while attempts have been made to ensure accuracy, certain words and phrases may not be transcribed as intended.    Return in about 1 year (around 01/12/2025) for CPE.     Carsyn Taubman  Z Bacchus, FNP

## 2024-01-13 NOTE — Assessment & Plan Note (Signed)
 Patient educated on CDC recommendation for the vaccine. Verbal consent was obtained from the patient, vaccine administered by nurse, no sign of adverse reactions noted at this time. Patient education on arm soreness and use of tylenol or ibuprofen for this patient  was discussed. Patient educated on the signs and symptoms of adverse effect and advise to contact the office if they occur.

## 2024-01-13 NOTE — Assessment & Plan Note (Signed)

## 2024-01-13 NOTE — Assessment & Plan Note (Signed)
 Pending labs

## 2024-01-13 NOTE — Patient Instructions (Signed)
 I appreciate the opportunity to provide care to you today!    Follow up:  1 year for CPE  Labs: please stop by the lab today to get your blood drawn (CBC, CMP, TSH, Lipid profile, HgA1c, Vit D)  Please stop by [facility] to get a chest x-ray for surgical clearance  For a Healthier YOU, I Recommend: Reducing your intake of sugar, sodium, carbohydrates, and saturated fats. Increasing your fiber intake by incorporating more whole grains, fruits, and vegetables into your meals. Setting healthy goals with a focus on lowering your consumption of carbs, sugar, and unhealthy fats. Adding variety to your diet by including a wide range of fruits and vegetables. Cutting back on soda and limiting processed foods as much as possible. Staying active: In addition to taking your weight loss medication, aim for at least 150 minutes of moderate-intensity physical activity each week for optimal results.    Please follow up if your symptoms worsen or fail to improve.  Please continue to a heart-healthy diet and increase your physical activities. Try to exercise for at least five days a week.    It was a pleasure to see you and I look forward to continuing to work together on your health and well-being. Please do not hesitate to call the office if you need care or have questions about your care.  In case of emergency, please visit the Emergency Department for urgent care, or contact our clinic at [phone] to schedule an appointment. We're here to help you!   Have a wonderful day and week. With Gratitude, Meade JENEANE Gerlach MSN, FNP-BC, PMHNP-BC

## 2024-01-14 LAB — CMP14+EGFR
ALT: 44 IU/L — ABNORMAL HIGH (ref 0–32)
AST: 35 IU/L (ref 0–40)
Albumin: 4.9 g/dL (ref 3.8–4.9)
Alkaline Phosphatase: 17 IU/L — ABNORMAL LOW (ref 49–135)
BUN/Creatinine Ratio: 11 (ref 9–23)
BUN: 11 mg/dL (ref 6–24)
Bilirubin Total: 0.8 mg/dL (ref 0.0–1.2)
CO2: 25 mmol/L (ref 20–29)
Calcium: 10.6 mg/dL — ABNORMAL HIGH (ref 8.7–10.2)
Chloride: 98 mmol/L (ref 96–106)
Creatinine, Ser: 1.04 mg/dL — ABNORMAL HIGH (ref 0.57–1.00)
Globulin, Total: 3.3 g/dL (ref 1.5–4.5)
Glucose: 90 mg/dL (ref 70–99)
Potassium: 4.6 mmol/L (ref 3.5–5.2)
Sodium: 139 mmol/L (ref 134–144)
Total Protein: 8.2 g/dL (ref 6.0–8.5)
eGFR: 63 mL/min/1.73 (ref >59)

## 2024-01-14 LAB — TSH+FREE T4
Free T4: 1.33 ng/dL (ref 0.82–1.77)
TSH: 1.01 u[IU]/mL (ref 0.450–4.500)

## 2024-01-14 LAB — CBC WITH DIFFERENTIAL/PLATELET
Basophils Absolute: 0 x10E3/uL (ref 0.0–0.2)
Basos: 1 %
EOS (ABSOLUTE): 0.3 x10E3/uL (ref 0.0–0.4)
Eos: 4 %
Hematocrit: 53.5 % — ABNORMAL HIGH (ref 34.0–46.6)
Hemoglobin: 15.9 g/dL (ref 11.1–15.9)
Immature Grans (Abs): 0 x10E3/uL (ref 0.0–0.1)
Immature Granulocytes: 0 %
Lymphocytes Absolute: 2.7 x10E3/uL (ref 0.7–3.1)
Lymphs: 39 %
MCH: 25.7 pg — ABNORMAL LOW (ref 26.6–33.0)
MCHC: 29.7 g/dL — ABNORMAL LOW (ref 31.5–35.7)
MCV: 86 fL (ref 79–97)
Monocytes Absolute: 0.6 x10E3/uL (ref 0.1–0.9)
Monocytes: 9 %
Neutrophils Absolute: 3.3 x10E3/uL (ref 1.4–7.0)
Neutrophils: 47 %
Platelets: 275 x10E3/uL (ref 150–450)
RBC: 6.19 x10E6/uL — ABNORMAL HIGH (ref 3.77–5.28)
RDW: 15.3 % (ref 11.7–15.4)
WBC: 6.9 x10E3/uL (ref 3.4–10.8)

## 2024-01-14 LAB — LIPID PANEL
Chol/HDL Ratio: 4.8 ratio — ABNORMAL HIGH (ref 0.0–4.4)
Cholesterol, Total: 105 mg/dL (ref 100–199)
HDL: 22 mg/dL — ABNORMAL LOW (ref >39)
LDL Chol Calc (NIH): 68 mg/dL (ref 0–99)
Triglycerides: 71 mg/dL (ref 0–149)
VLDL Cholesterol Cal: 15 mg/dL (ref 5–40)

## 2024-01-14 LAB — HEMOGLOBIN A1C
Est. average glucose Bld gHb Est-mCnc: 137 mg/dL
Hgb A1c MFr Bld: 6.4 % — ABNORMAL HIGH (ref 4.8–5.6)

## 2024-01-14 LAB — VITAMIN D 25 HYDROXY (VIT D DEFICIENCY, FRACTURES): Vit D, 25-Hydroxy: 101 ng/mL — ABNORMAL HIGH (ref 30.0–100.0)

## 2024-01-15 LAB — MICROALBUMIN / CREATININE URINE RATIO
Creatinine, Urine: 66 mg/dL
Microalb/Creat Ratio: 28 mg/g{creat} (ref 0–29)
Microalbumin, Urine: 18.4 ug/mL

## 2024-01-17 ENCOUNTER — Ambulatory Visit: Payer: Self-pay | Admitting: Family Medicine

## 2024-01-18 LAB — HM MAMMOGRAPHY

## 2024-01-21 ENCOUNTER — Encounter: Payer: Self-pay | Admitting: Family Medicine

## 2024-01-23 ENCOUNTER — Encounter: Payer: Self-pay | Admitting: Family Medicine

## 2024-01-23 NOTE — Telephone Encounter (Signed)
 Completed. The patient will pick up her forms tomorrow morning.

## 2024-02-10 ENCOUNTER — Encounter: Payer: Self-pay | Admitting: Family Medicine

## 2024-03-04 ENCOUNTER — Telehealth: Payer: Self-pay | Admitting: Family Medicine

## 2024-03-04 DIAGNOSIS — E1165 Type 2 diabetes mellitus with hyperglycemia: Secondary | ICD-10-CM

## 2024-03-04 NOTE — Telephone Encounter (Signed)
 Copied from CRM #[id number]. Topic: Clinical - Medication Refill >> Mar 04, 2024 12:16 PM Santiya F wrote: Medication: Dulaglutide  (TRULICITY ) 1.5 MG/0.5ML [name] [[id number]]  Has the patient contacted their pharmacy? Yes  (Agent: If yes, when and what did the pharmacy advise?) contact office   This is the patient's preferred pharmacy:  [id number] [place], [place] - [address] [facility] [address] [place] [address] Phone: [phone] Fax: [phone]  Is this the correct pharmacy for this prescription? Yes If no, delete pharmacy and type the correct one.   Patient is requesting a call when the medication has been sent in.   Has the prescription been filled recently? Yes  Is the patient out of the medication? Yes, patient's next dose is tomorrow and she is out.   Has the patient been seen for an appointment in the last year OR does the patient have an upcoming appointment? Yes  Can we respond through MyChart? Yes  Agent: Please be advised that Rx refills may take up to 3 business days. We ask that you follow-up with your pharmacy.

## 2024-03-05 ENCOUNTER — Other Ambulatory Visit: Payer: Self-pay | Admitting: Family Medicine

## 2024-03-05 DIAGNOSIS — E1165 Type 2 diabetes mellitus with hyperglycemia: Secondary | ICD-10-CM

## 2024-03-05 MED ORDER — TRULICITY 3 MG/0.5ML ~~LOC~~ SOAJ: 3.0000 mg | SUBCUTANEOUS | 0 refills | Status: DC

## 2024-04-02 ENCOUNTER — Other Ambulatory Visit: Payer: Self-pay | Admitting: Family Medicine

## 2024-04-02 DIAGNOSIS — E1165 Type 2 diabetes mellitus with hyperglycemia: Secondary | ICD-10-CM

## 2024-04-06 DIAGNOSIS — E1165 Type 2 diabetes mellitus with hyperglycemia: Secondary | ICD-10-CM

## 2024-04-06 MED ORDER — TRULICITY 4.5 MG/0.5ML ~~LOC~~ SOAJ: 4.5000 mg | SUBCUTANEOUS | 0 refills | Status: AC

## 2024-04-14 ENCOUNTER — Other Ambulatory Visit: Payer: Self-pay | Admitting: Family Medicine

## 2024-04-14 DIAGNOSIS — J45909 Unspecified asthma, uncomplicated: Secondary | ICD-10-CM

## 2024-04-23 ENCOUNTER — Other Ambulatory Visit (HOSPITAL_COMMUNITY)
Admission: RE | Admit: 2024-04-23 | Discharge: 2024-04-23 | Disposition: A | Discharge status: Home / Self Care | Source: Ambulatory Visit | Attending: Internal Medicine | Admitting: Internal Medicine

## 2024-04-23 LAB — CBC
HCT: 48.3 % — ABNORMAL HIGH (ref 36.0–46.0)
Hemoglobin: 15.5 g/dL — ABNORMAL HIGH (ref 12.0–15.0)
MCH: 27.2 pg (ref 26.0–34.0)
MCHC: 32.1 g/dL (ref 30.0–36.0)
MCV: 84.9 fL (ref 80.0–100.0)
Platelets: 288 K/uL (ref 150–400)
RBC: 5.69 MIL/uL — ABNORMAL HIGH (ref 3.87–5.11)
RDW: 15.8 % — ABNORMAL HIGH (ref 11.5–15.5)
WBC: 7.5 K/uL (ref 4.0–10.5)
nRBC: 0 % (ref 0.0–0.2)

## 2025-01-12 ENCOUNTER — Encounter: Admitting: Family Medicine
